# Patient Record
Sex: Female | Born: 1950
Health system: Southern US, Community
[De-identification: ages and names within clinical notes are randomized; demographics above are authoritative.]

## PROBLEM LIST (undated history)

## (undated) DIAGNOSIS — I10 Essential (primary) hypertension: Secondary | ICD-10-CM

## (undated) DIAGNOSIS — G4730 Sleep apnea, unspecified: Secondary | ICD-10-CM

## (undated) DIAGNOSIS — R011 Cardiac murmur, unspecified: Secondary | ICD-10-CM

## (undated) DIAGNOSIS — G2581 Restless legs syndrome: Secondary | ICD-10-CM

## (undated) DIAGNOSIS — F329 Major depressive disorder, single episode, unspecified: Secondary | ICD-10-CM

## (undated) DIAGNOSIS — B1920 Unspecified viral hepatitis C without hepatic coma: Secondary | ICD-10-CM

## (undated) DIAGNOSIS — E278 Other specified disorders of adrenal gland: Secondary | ICD-10-CM

## (undated) DIAGNOSIS — F32A Depression, unspecified: Secondary | ICD-10-CM

## (undated) DIAGNOSIS — E039 Hypothyroidism, unspecified: Secondary | ICD-10-CM

## (undated) HISTORY — DX: Essential (primary) hypertension: I10

## (undated) HISTORY — DX: Restless legs syndrome: G25.81

## (undated) HISTORY — DX: Cardiac murmur, unspecified: R01.1

## (undated) HISTORY — DX: Major depressive disorder, single episode, unspecified: F32.9

## (undated) HISTORY — DX: Depression, unspecified: F32.A

## (undated) HISTORY — DX: Sleep apnea, unspecified: G47.30

## (undated) HISTORY — DX: Hypothyroidism, unspecified: E03.9

## (undated) HISTORY — DX: Other specified disorders of adrenal gland: E27.8

## (undated) HISTORY — DX: Unspecified viral hepatitis C without hepatic coma: B19.20

---

## 1972-07-20 HISTORY — PX: APPENDECTOMY: SHX54

## 2002-07-20 HISTORY — PX: REPLACEMENT TOTAL KNEE: SUR1224

## 2005-07-20 HISTORY — PX: CATARACT EXTRACTION: SUR2

## 2011-10-23 DIAGNOSIS — D3500 Benign neoplasm of unspecified adrenal gland: Secondary | ICD-10-CM | POA: Insufficient documentation

## 2011-10-23 HISTORY — PX: ANKLE FRACTURE SURGERY: SHX122

## 2012-10-26 ENCOUNTER — Ambulatory Visit: Payer: Self-pay | Admitting: Family Medicine

## 2013-03-16 ENCOUNTER — Ambulatory Visit: Payer: Self-pay | Admitting: Ophthalmology

## 2013-03-27 ENCOUNTER — Ambulatory Visit: Payer: Self-pay | Admitting: Ophthalmology

## 2013-11-08 ENCOUNTER — Ambulatory Visit: Payer: Self-pay | Admitting: Gastroenterology

## 2013-12-01 ENCOUNTER — Ambulatory Visit: Payer: Self-pay | Admitting: Gastroenterology

## 2014-03-15 ENCOUNTER — Ambulatory Visit: Payer: Self-pay | Admitting: Emergency Medicine

## 2014-03-19 ENCOUNTER — Ambulatory Visit: Payer: Self-pay | Admitting: Family Medicine

## 2014-11-09 NOTE — Op Note (Signed)
PATIENT NAME:  Lauren Combs, Lauren Combs MR#:  [id number] DATE OF BIRTH:  July 20, 1951  DATE OF PROCEDURE:  03/27/2013  PREOPERATIVE DIAGNOSIS:  Cataract, right eye.   POSTOPERATIVE DIAGNOSIS:  Cataract, right eye.  PROCEDURE PERFORMED:  Extracapsular cataract extraction using phacoemulsification with placement of an Alcon [id number], 18.5-diopter posterior chamber lens, serial #[id number].  SURGEON:  Loura Back. Cristan Scherzer, MD  ASSISTANT:  None.  ANESTHESIA:  4% lidocaine and 0.75% Marcaine in a 50/50 mixture with 10 units/mL of Hylenex added, given as a peribulbar.   ANESTHESIOLOGIST:  Dr. Andree Elk.  COMPLICATIONS:  None.  ESTIMATED BLOOD LOSS:  Less than 1 ml.  DESCRIPTION OF PROCEDURE:  The patient was brought to the operating room and given a peribulbar block.  The patient was then prepped and draped in the usual fashion.  The vertical rectus muscles were imbricated using 5-0 silk sutures.  These sutures were then clamped to the sterile drapes as bridle sutures.  A limbal peritomy was performed extending two clock hours and hemostasis was obtained with cautery.  A partial thickness scleral groove was made at the surgical limbus and dissected anteriorly in a lamellar dissection using an Alcon crescent knife.  The anterior chamber was entered superonasally with a Superblade and through the lamellar dissection with a 2.6 mm keratome.  DisCoVisc was used to replace the aqueous and a continuous tear capsulorrhexis was carried out.  Hydrodissection and hydrodelineation were carried out with balanced salt and a 27 gauge canula.  The nucleus was rotated to confirm the effectiveness of the hydrodissection.  Phacoemulsification was carried out using a divide-and-conquer technique.  Total ultrasound time was 1 minutes and 27 seconds with an average power of 24.0 percent and CDE 39.80.  Irrigation/aspiration was used to remove the residual cortex.  DisCoVisc was used to inflate the capsule and the internal  incision was enlarged to 3 mm with the crescent knife.  The intraocular lens was folded and inserted into the capsular bag using the AcrySert delivery system.  Irrigation/aspiration was used to remove the residual DisCoVisc.  Miostat was injected into the anterior chamber through the paracentesis track to inflate the anterior chamber and induce miosis.  A tenth of a milliliter of cefuroxime, delivering 1 mg of the drug, was placed into the anterior chamber via the paracentesis tract. The wound was checked for leaks and none were found. The conjunctiva was closed with cautery and the bridle sutures were removed.  Two drops of 0.3% Vigamox were placed on the eye.   An eye shield was placed on the eye.  The patient was discharged to the recovery room in good condition. ____________________________ Loura Back Mattia Osterman, MD sad:sb D: 03/27/2013 14:07:50 ET T: 03/27/2013 15:11:30 ET JOB#: [id number]  cc: Remo Lipps A. Zyaire Dumas, MD, <Dictator> Martie Lee MD ELECTRONICALLY SIGNED 04/03/2013 13:12

## 2014-12-31 ENCOUNTER — Other Ambulatory Visit: Payer: Self-pay | Admitting: Family Medicine

## 2014-12-31 DIAGNOSIS — I10 Essential (primary) hypertension: Secondary | ICD-10-CM | POA: Insufficient documentation

## 2014-12-31 HISTORY — DX: Essential (primary) hypertension: I10

## 2014-12-31 MED ORDER — BISOPROLOL-HYDROCHLOROTHIAZIDE 10-6.25 MG PO TABS
1.0000 | ORAL_TABLET | Freq: Every day | ORAL | Status: DC
Start: 2014-12-31 — End: 2015-01-25

## 2014-12-31 NOTE — Telephone Encounter (Signed)
Prescription for Ziac 10-6.25 g is sent to patient's pharmacy.

## 2014-12-31 NOTE — Telephone Encounter (Signed)
PT IS TOTALLY OUT OF MEDS AND IS HAVING PALAPATIONS

## 2015-01-01 NOTE — Telephone Encounter (Signed)
Refill completed by Dr. Manuella Ghazi.

## 2015-01-03 ENCOUNTER — Telehealth: Payer: Self-pay | Admitting: Family Medicine

## 2015-01-03 MED ORDER — TEMAZEPAM 15 MG PO CAPS: 15.0000 mg | ORAL_CAPSULE | Freq: Every evening | ORAL | Status: DC | PRN

## 2015-01-03 NOTE — Telephone Encounter (Signed)
Erroneous enounter

## 2015-01-03 NOTE — Telephone Encounter (Signed)
Pt is requesting a refill on Restoril 15mg .  She is completely out.  Pt has an upcoming scheduled appointment on 01/24/15 @ 11am

## 2015-01-22 ENCOUNTER — Ambulatory Visit: Payer: Self-pay | Admitting: Gastroenterology

## 2015-01-22 DIAGNOSIS — R740 Nonspecific elevation of levels of transaminase and lactic acid dehydrogenase [LDH]: Secondary | ICD-10-CM

## 2015-01-22 DIAGNOSIS — H65499 Other chronic nonsuppurative otitis media, unspecified ear: Secondary | ICD-10-CM | POA: Insufficient documentation

## 2015-01-22 DIAGNOSIS — E039 Hypothyroidism, unspecified: Secondary | ICD-10-CM | POA: Insufficient documentation

## 2015-01-22 DIAGNOSIS — IMO0002 Reserved for concepts with insufficient information to code with codable children: Secondary | ICD-10-CM | POA: Insufficient documentation

## 2015-01-22 DIAGNOSIS — G4730 Sleep apnea, unspecified: Secondary | ICD-10-CM

## 2015-01-22 DIAGNOSIS — B1920 Unspecified viral hepatitis C without hepatic coma: Secondary | ICD-10-CM

## 2015-01-22 DIAGNOSIS — A09 Infectious gastroenteritis and colitis, unspecified: Secondary | ICD-10-CM | POA: Insufficient documentation

## 2015-01-22 DIAGNOSIS — E278 Other specified disorders of adrenal gland: Secondary | ICD-10-CM | POA: Insufficient documentation

## 2015-01-22 DIAGNOSIS — N281 Cyst of kidney, acquired: Secondary | ICD-10-CM | POA: Insufficient documentation

## 2015-01-22 DIAGNOSIS — Z9189 Other specified personal risk factors, not elsewhere classified: Secondary | ICD-10-CM | POA: Insufficient documentation

## 2015-01-22 DIAGNOSIS — F329 Major depressive disorder, single episode, unspecified: Secondary | ICD-10-CM | POA: Insufficient documentation

## 2015-01-22 DIAGNOSIS — G2581 Restless legs syndrome: Secondary | ICD-10-CM | POA: Insufficient documentation

## 2015-01-22 DIAGNOSIS — Z13220 Encounter for screening for lipoid disorders: Secondary | ICD-10-CM | POA: Insufficient documentation

## 2015-01-22 DIAGNOSIS — R0789 Other chest pain: Secondary | ICD-10-CM | POA: Insufficient documentation

## 2015-01-22 DIAGNOSIS — Z0000 Encounter for general adult medical examination without abnormal findings: Secondary | ICD-10-CM | POA: Insufficient documentation

## 2015-01-22 DIAGNOSIS — G4700 Insomnia, unspecified: Secondary | ICD-10-CM | POA: Insufficient documentation

## 2015-01-22 DIAGNOSIS — J0110 Acute frontal sinusitis, unspecified: Secondary | ICD-10-CM | POA: Insufficient documentation

## 2015-01-22 DIAGNOSIS — J019 Acute sinusitis, unspecified: Secondary | ICD-10-CM | POA: Insufficient documentation

## 2015-01-22 DIAGNOSIS — J309 Allergic rhinitis, unspecified: Secondary | ICD-10-CM | POA: Insufficient documentation

## 2015-01-22 DIAGNOSIS — F32A Depression, unspecified: Secondary | ICD-10-CM | POA: Insufficient documentation

## 2015-01-22 DIAGNOSIS — R011 Cardiac murmur, unspecified: Secondary | ICD-10-CM | POA: Insufficient documentation

## 2015-01-22 DIAGNOSIS — H9209 Otalgia, unspecified ear: Secondary | ICD-10-CM | POA: Insufficient documentation

## 2015-01-22 DIAGNOSIS — Z111 Encounter for screening for respiratory tuberculosis: Secondary | ICD-10-CM | POA: Insufficient documentation

## 2015-01-22 HISTORY — DX: Cardiac murmur, unspecified: R01.1

## 2015-01-22 HISTORY — DX: Other specified disorders of adrenal gland: E27.8

## 2015-01-22 HISTORY — DX: Hypothyroidism, unspecified: E03.9

## 2015-01-22 HISTORY — DX: Unspecified viral hepatitis C without hepatic coma: B19.20

## 2015-01-22 HISTORY — DX: Sleep apnea, unspecified: G47.30

## 2015-01-22 HISTORY — DX: Depression, unspecified: F32.A

## 2015-01-22 HISTORY — DX: Restless legs syndrome: G25.81

## 2015-01-24 ENCOUNTER — Encounter: Payer: Self-pay | Admitting: Family Medicine

## 2015-01-25 ENCOUNTER — Other Ambulatory Visit: Payer: Self-pay | Admitting: Family Medicine

## 2015-01-25 DIAGNOSIS — I10 Essential (primary) hypertension: Secondary | ICD-10-CM

## 2015-01-25 MED ORDER — LEVOTHYROXINE SODIUM 125 MCG PO TABS: 125.0000 ug | ORAL_TABLET | Freq: Every day | ORAL | Status: DC

## 2015-01-25 MED ORDER — BISOPROLOL-HYDROCHLOROTHIAZIDE 10-6.25 MG PO TABS: 1.0000 | ORAL_TABLET | Freq: Every day | ORAL | Status: DC

## 2015-01-25 MED ORDER — TEMAZEPAM 15 MG PO CAPS: 15.0000 mg | ORAL_CAPSULE | Freq: Every evening | ORAL | Status: DC | PRN

## 2015-01-25 NOTE — Telephone Encounter (Signed)
Patient had appt yesterday 01/24/2015 but had to leave due to wait time, refilled meds, pt next appt 02/01/15

## 2015-01-30 NOTE — Progress Notes (Signed)
This encounter was created in error - please disregard.

## 2015-01-31 ENCOUNTER — Telehealth: Payer: Self-pay | Admitting: Family Medicine

## 2015-01-31 NOTE — Telephone Encounter (Signed)
Pt had appointment for tomorrow however she had to reschedule it because her medicare insurance the medical part will not start until 02-18-15. She is requesting enough of paroxepine 30mg  be called in to [facility]. She has been completely out for 1wk.

## 2015-01-31 NOTE — Telephone Encounter (Signed)
Okay to refill Paroxetine 30 mg 1 tablet daily for 30 days

## 2015-01-31 NOTE — Telephone Encounter (Signed)
Patient is requesting refill of Paroxepine 30mg , she had an appointment scheduled for 02/01/2015 butshe cancelled appointment due to her medical insurance will not start until 02/18/2015 says she has been completely out for 1 week. I did not reorder medication because per our records this medication PARoxetine HCl 30MG , 1 Tablet daily, #90, 90 days starting 06/29/2014, No Refill. Active.

## 2015-02-01 ENCOUNTER — Encounter: Payer: Self-pay | Admitting: Family Medicine

## 2015-02-01 MED ORDER — PAROXETINE HCL 30 MG PO TABS: 30.0000 mg | ORAL_TABLET | Freq: Every day | ORAL | Status: DC

## 2015-02-01 NOTE — Telephone Encounter (Signed)
Medication has been refilled and sent to [place], patient has been notified

## 2015-02-19 ENCOUNTER — Encounter: Payer: Self-pay | Admitting: Family Medicine

## 2015-02-19 ENCOUNTER — Ambulatory Visit (INDEPENDENT_AMBULATORY_CARE_PROVIDER_SITE_OTHER): Payer: Medicare Other | Admitting: Family Medicine

## 2015-02-19 VITALS — BP 108/63 | HR 70 | Temp 98.4°F | Resp 16 | Ht 63.0 in | Wt 233.8 lb

## 2015-02-19 DIAGNOSIS — B182 Chronic viral hepatitis C: Secondary | ICD-10-CM | POA: Diagnosis not present

## 2015-02-19 DIAGNOSIS — I10 Essential (primary) hypertension: Secondary | ICD-10-CM

## 2015-02-19 MED ORDER — BISOPROLOL FUMARATE 10 MG PO TABS: 10.0000 mg | ORAL_TABLET | Freq: Every day | ORAL | Status: DC

## 2015-02-19 NOTE — Progress Notes (Signed)
Name: Lauren Combs   MRN: [id number]    DOB: [birth year]   Date:02/19/2015       Progress Note  Subjective  Chief Complaint  Chief Complaint  Patient presents with  . Annual Exam    CPE  . Referral    Hep C  . Medication Refill    HPI Pt. Is here to renew her referral to Dr. Allen Norris (gastroenterology) for chronic Hepatitis C. She has been seen by Dr. Allen Norris last year and he recommended treatment with Solvaldi and ribavirin but the insurance did not approve the treatment. She now has Medicare and is requesting a referral back to Dr. Allen Norris for continued follow up of Hep C.  Past Medical History  Diagnosis Date  . Depression   . Heart murmur     Past Surgical History  Procedure Laterality Date  . Appendectomy  1974  . Replacement total knee Bilateral 2004  . Cataract extraction Left 2007  . Ankle fracture surgery Right 10/23/2011    Reduction of fracture;open    Family History  Problem Relation Age of Onset  . Cancer Mother     Lung Cancer  . Heart disease Father   . Diabetes Father   . Fibromyalgia Sister   . Thyroid disease Daughter     History   Social History  . Marital Status: Divorced    Spouse Name: N/A  . Number of Children: 2  . Years of Education: N/A   Occupational History  . Not on file.   Social History Main Topics  . Smoking status: Never Smoker   . Smokeless tobacco: Never Used  . Alcohol Use: 0.0 oz/week    0 Standard drinks or equivalent per week     Comment: occasional alcohol use  . Drug Use: No  . Sexual Activity: Not on file   Other Topics Concern  . Not on file   Social History Narrative     Current outpatient prescriptions:  .  bisoprolol-hydrochlorothiazide (ZIAC) 10-6.25 MG per tablet, Take 1 tablet by mouth daily., Disp: 30 tablet, Rfl: 0 .  levothyroxine (SYNTHROID, LEVOTHROID) 125 MCG tablet, Take 1 tablet (125 mcg total) by mouth daily before breakfast. 125 MCG tablet taken daily., Disp: 30 tablet, Rfl: 0 .  PARoxetine  (PAXIL) 30 MG tablet, Take 1 tablet (30 mg total) by mouth daily., Disp: 30 tablet, Rfl: 0 .  temazepam (RESTORIL) 15 MG capsule, Take 1 capsule (15 mg total) by mouth at bedtime as needed for sleep., Disp: 30 capsule, Rfl: 0 .  triamcinolone (NASACORT ALLERGY 24HR) 55 MCG/ACT AERO nasal inhaler, Place 2 sprays into the nose., Disp: , Rfl:  .  oxyCODONE (OXY IR/ROXICODONE) 5 MG immediate release tablet, Take 1 tablet by mouth., Disp: , Rfl:   No Known Allergies   Review of Systems  Constitutional: Positive for malaise/fatigue. Negative for fever and chills.  Respiratory: Negative for shortness of breath.   Cardiovascular: Negative for chest pain.  Gastrointestinal: Positive for nausea. Negative for vomiting, abdominal pain, diarrhea and blood in stool.      Objective  Filed Vitals:   02/19/15 1152  BP: 108/63  Pulse: 70  Temp: 98.4 F (36.9 C)  TempSrc: Oral  Resp: 16  Height: 5\' 3"  (1.6 m)  Weight: 233 lb 12.8 oz (106.051 kg)  SpO2: 93%    Physical Exam  Constitutional: She is oriented to person, place, and time and well-developed, well-nourished, and in no distress.  HENT:  Head: Normocephalic and atraumatic.  Cardiovascular: Normal rate and regular rhythm.   Pulmonary/Chest: Effort normal and breath sounds normal.  Neurological: She is alert and oriented to person, place, and time.  Nursing note and vitals reviewed.    Assessment & Plan 1. Benign essential HTN DC HCTZ because of lower than normal blood pressure. Patient is asymptomatic. Follow-up in 3 months. - bisoprolol (ZEBETA) 10 MG tablet; Take 1 tablet (10 mg total) by mouth daily.  Dispense: 90 tablet; Refill: 0  2. Chronic hepatitis C without hepatic coma Patient has chronic hepatitis C, and is being followed by Dr. Allen Norris. Referral to gastroenterology will be renewed. - Ambulatory referral to Gastroenterology    Dossie Der Asad A. [facility] 02/19/2015 12:20  PM

## 2015-02-21 ENCOUNTER — Encounter: Payer: Self-pay | Admitting: Gastroenterology

## 2015-02-21 ENCOUNTER — Encounter (INDEPENDENT_AMBULATORY_CARE_PROVIDER_SITE_OTHER): Payer: Self-pay

## 2015-02-21 ENCOUNTER — Ambulatory Visit: Payer: Medicare Other | Admitting: Gastroenterology

## 2015-02-21 ENCOUNTER — Other Ambulatory Visit: Payer: Self-pay

## 2015-02-21 ENCOUNTER — Ambulatory Visit (INDEPENDENT_AMBULATORY_CARE_PROVIDER_SITE_OTHER): Payer: Medicare Other | Admitting: Gastroenterology

## 2015-02-21 VITALS — BP 167/93 | HR 77 | Temp 97.5°F | Ht 65.0 in | Wt 236.0 lb

## 2015-02-21 DIAGNOSIS — B182 Chronic viral hepatitis C: Secondary | ICD-10-CM | POA: Diagnosis not present

## 2015-02-21 NOTE — Progress Notes (Signed)
   Primary Care Physician: Keith Rake, MD  Primary Gastroenterologist:  Dr. Lucilla Lame  Chief Complaint  Patient presents with  . follow up Hep C    HPI: Lauren Combs is a 64 y.o. female here for follow-up of hepatitis C. The last time she was seen her fibrosis score was too low for the medication to be covered by the insurance. The patient states she has been feeling well. She also reports that she was likely infected with hepatitis C many years ago. The patient has no skin symptoms at the present time. She would like to now be treated for her hepatitis C.  Current Outpatient Prescriptions  Medication Sig Dispense Refill  . bisoprolol (ZEBETA) 10 MG tablet Take 1 tablet (10 mg total) by mouth daily. 90 tablet 0  . levothyroxine (SYNTHROID, LEVOTHROID) 125 MCG tablet Take 1 tablet (125 mcg total) by mouth daily before breakfast. 125 MCG tablet taken daily. 30 tablet 0  . PARoxetine (PAXIL) 30 MG tablet Take 1 tablet (30 mg total) by mouth daily. 30 tablet 0  . temazepam (RESTORIL) 15 MG capsule Take 1 capsule (15 mg total) by mouth at bedtime as needed for sleep. 30 capsule 0  . triamcinolone (NASACORT ALLERGY 24HR) 55 MCG/ACT AERO nasal inhaler Place 2 sprays into the nose.     No current facility-administered medications for this visit.    Allergies as of 02/21/2015  . (No Known Allergies)    ROS:  General: Negative for anorexia, weight loss, fever, chills, fatigue, weakness. ENT: Negative for hoarseness, difficulty swallowing , nasal congestion. CV: Negative for chest pain, angina, palpitations, dyspnea on exertion, peripheral edema.  Respiratory: Negative for dyspnea at rest, dyspnea on exertion, cough, sputum, wheezing.  GI: See history of present illness. GU:  Negative for dysuria, hematuria, urinary incontinence, urinary frequency, nocturnal urination.  Endo: Negative for unusual weight change.    Physical Examination:   BP 167/93 mmHg  Pulse 77  Temp(Src) 97.5  F (36.4 C) (Oral)  Ht 5\' 5"  (1.651 m)  Wt 236 lb (107.049 kg)  BMI 39.27 kg/m2  General: Well-nourished, well-developed in no acute distress.  Eyes: No icterus. Conjunctivae pink. Mouth: Oropharyngeal mucosa moist and pink , no lesions erythema or exudate. Lungs: Clear to auscultation bilaterally. Non-labored. Heart: Regular rate and rhythm, no murmurs rubs or gallops.  Abdomen: Bowel sounds are normal, nontender, nondistended, no hepatosplenomegaly or masses, no abdominal bruits or hernia , no rebound or guarding.   Extremities: No lower extremity edema. No clubbing or deformities. Neuro: Alert and oriented x 3.  Grossly intact. Skin: Warm and dry, no jaundice.   Psych: Alert and cooperative, normal mood and affect.  Labs:    Imaging Studies: No results found.  Assessment and Plan:   [name] is a 64 y.o. y/o female who comes in with a history of hepatitis C contracted approximate 40 years ago. The patient now would like to be treated for her hepatitis C. Recent labs will be drawn and the patient will then be prescribed treatment for her hepatitis C.   Note: This dictation was prepared with Dragon dictation along with smaller phrase technology. Any transcriptional errors that result from this process are unintentional.

## 2015-02-28 ENCOUNTER — Encounter: Payer: Self-pay | Admitting: Gastroenterology

## 2015-03-03 ENCOUNTER — Other Ambulatory Visit: Payer: Self-pay | Admitting: Family Medicine

## 2015-03-07 ENCOUNTER — Telehealth: Payer: Self-pay | Admitting: Family Medicine

## 2015-03-07 MED ORDER — TEMAZEPAM 15 MG PO CAPS: 15.0000 mg | ORAL_CAPSULE | Freq: Every evening | ORAL | Status: DC | PRN

## 2015-03-07 NOTE — Telephone Encounter (Signed)
Medication has been refilled and sent to [facility] [address] 

## 2015-03-07 NOTE — Telephone Encounter (Signed)
Requesting refill on Temazepam. This is her sleep aid and she is requesting that it be sent to [facility]

## 2015-04-01 ENCOUNTER — Other Ambulatory Visit: Payer: Self-pay | Admitting: Family Medicine

## 2015-04-02 NOTE — Telephone Encounter (Signed)
Medication has been refilled and sent to [address]

## 2015-04-09 ENCOUNTER — Other Ambulatory Visit: Payer: Self-pay

## 2015-04-09 DIAGNOSIS — B182 Chronic viral hepatitis C: Secondary | ICD-10-CM

## 2015-04-25 NOTE — Telephone Encounter (Signed)
error 

## 2015-05-05 ENCOUNTER — Other Ambulatory Visit: Payer: Self-pay | Admitting: Family Medicine

## 2015-05-22 ENCOUNTER — Ambulatory Visit: Payer: Medicare Other | Admitting: Family Medicine

## 2015-05-30 ENCOUNTER — Ambulatory Visit: Payer: Medicare Other | Admitting: Family Medicine

## 2015-05-30 ENCOUNTER — Ambulatory Visit (INDEPENDENT_AMBULATORY_CARE_PROVIDER_SITE_OTHER): Payer: Medicare Other | Admitting: Family Medicine

## 2015-05-30 ENCOUNTER — Encounter: Payer: Self-pay | Admitting: Family Medicine

## 2015-05-30 VITALS — BP 132/74 | HR 79 | Temp 98.6°F | Resp 16 | Ht 65.0 in | Wt 237.6 lb

## 2015-05-30 DIAGNOSIS — G4700 Insomnia, unspecified: Secondary | ICD-10-CM | POA: Diagnosis not present

## 2015-05-30 MED ORDER — TEMAZEPAM 15 MG PO CAPS: 15.0000 mg | ORAL_CAPSULE | Freq: Every evening | ORAL | Status: DC | PRN

## 2015-05-30 NOTE — Progress Notes (Signed)
Name: Lauren Combs   MRN: [id number]    DOB: 1950-08-29   Date:05/30/2015       Progress Note  Subjective  Chief Complaint  Chief Complaint  Patient presents with  . Medication Management    this patient is here due to her insurance wanting her to switch to another medication that will help with insomina. [facility] sent a form with the suggested medication; it's attached.    Insomnia Primary symptoms: difficulty falling asleep, frequent awakening.  Past treatments include medication. Typical bedtime:  10-11 P.M..  How long after going to bed to you fall asleep: 15-30 minutes.   PMH includes: no depression, restless leg syndrome (history of restless leg syndrome, symptoms relieved with current therapy.).    Past Medical History  Diagnosis Date  . Depression   . Heart murmur   . Benign essential HTN 12/31/2014  . Hepatitis C infection 01/22/2015  . Adult hypothyroidism 01/22/2015  . Clinical depression 01/22/2015  . Apnea, sleep 01/22/2015  . Cardiac murmur 01/22/2015  . Restless leg 01/22/2015  . Adrenal mass ([place]) 01/22/2015    Past Surgical History  Procedure Laterality Date  . Appendectomy  1974  . Replacement total knee Bilateral 2004  . Cataract extraction Left 2007  . Ankle fracture surgery Right 10/23/2011    Reduction of fracture;open    Family History  Problem Relation Age of Onset  . Cancer Mother     Lung Cancer  . Heart disease Father   . Diabetes Father   . Fibromyalgia Sister   . Thyroid disease Daughter     Social History   Social History  . Marital Status: Divorced    Spouse Name: N/A  . Number of Children: 2  . Years of Education: N/A   Occupational History  . Not on file.   Social History Main Topics  . Smoking status: Never Smoker   . Smokeless tobacco: Never Used  . Alcohol Use: 0.0 oz/week    0 Standard drinks or equivalent per week     Comment: occasional alcohol use  . Drug Use: No  . Sexual Activity: Not on file   Other Topics Concern  .  Not on file   Social History Narrative     Current outpatient prescriptions:  .  bisoprolol (ZEBETA) 10 MG tablet, Take 1 tablet (10 mg total) by mouth daily., Disp: 90 tablet, Rfl: 0 .  DAKLINZA 60 MG TABS, , Disp: , Rfl:  .  levothyroxine (SYNTHROID, LEVOTHROID) 125 MCG tablet, Take 1 tablet (125 mcg total) by mouth daily before breakfast., Disp: 30 tablet, Rfl: 0 .  PARoxetine (PAXIL) 30 MG tablet, TAKE ONE TABLET BY MOUTH ONCE DAILY, Disp: 90 tablet, Rfl: 0 .  SOVALDI 400 MG TABS, , Disp: , Rfl:  .  temazepam (RESTORIL) 15 MG capsule, TAKE ONE CAPSULE BY MOUTH AT BEDTIME AS NEEDED FOR SLEEP, Disp: 30 capsule, Rfl: 2 .  triamcinolone (NASACORT ALLERGY 24HR) 55 MCG/ACT AERO nasal inhaler, Place 2 sprays into the nose., Disp: , Rfl:   No Known Allergies  Review of Systems  Respiratory: Negative for shortness of breath.   Cardiovascular: Negative for chest pain.  Neurological: Negative for dizziness.  Psychiatric/Behavioral: Negative for depression. The patient has insomnia. The patient is not nervous/anxious.     Objective  Filed Vitals:   05/30/15 1017  BP: 132/74  Pulse: 79  Temp: 98.6 F (37 C)  TempSrc: Oral  Resp: 16  Height: 5\' 5"  (1.651 m)  Weight:  237 lb 9.6 oz (107.775 kg)  SpO2: 96%    Physical Exam  Constitutional: She is oriented to person, place, and time and well-developed, well-nourished, and in no distress.  HENT:  Head: Normocephalic and atraumatic.  Cardiovascular: Normal rate, regular rhythm and normal heart sounds.   Pulmonary/Chest: Effort normal and breath sounds normal. She has no wheezes. She has no rales.  Neurological: She is alert and oriented to person, place, and time.  Psychiatric: Mood, memory, affect and judgment normal.  Nursing note and vitals reviewed.     Assessment & Plan  1. Cannot sleep  Discussed alternatives based on pharmacy recommendations. Patient is aware of the side effects and warnings about benzodiazepine use in  the elderly. She reports no side effects with her medicine and does not wish to discontinue taking Restoril at this time. Refills provided and follow-up in 3 months.  - temazepam (RESTORIL) 15 MG capsule; Take 1 capsule (15 mg total) by mouth at bedtime as needed. for sleep  Dispense: 30 capsule; Refill: 2   Basma Buchner Asad A. [facility] 05/30/2015 10:30 AM

## 2015-06-24 ENCOUNTER — Ambulatory Visit: Payer: Medicare Other | Admitting: Family Medicine

## 2015-08-01 ENCOUNTER — Other Ambulatory Visit: Payer: Self-pay | Admitting: Family Medicine

## 2015-08-08 ENCOUNTER — Other Ambulatory Visit: Payer: Self-pay

## 2015-08-08 ENCOUNTER — Telehealth: Payer: Self-pay | Admitting: Family Medicine

## 2015-08-08 NOTE — Telephone Encounter (Signed)
Pt needs refill on levothyroxine to be sent to [place].

## 2015-08-12 ENCOUNTER — Telehealth: Payer: Self-pay | Admitting: Family Medicine

## 2015-08-12 NOTE — Telephone Encounter (Signed)
Pt states she has been trying to get her Levothyroxone. Pt needs refill sent to [place].

## 2015-08-14 MED ORDER — LEVOTHYROXINE SODIUM 125 MCG PO TABS: 125.0000 ug | ORAL_TABLET | Freq: Every day | ORAL | Status: DC

## 2015-08-14 NOTE — Telephone Encounter (Signed)
Medication has been refilled and sent to [facility] [address] 

## 2015-08-15 NOTE — Telephone Encounter (Signed)
Medication has been refilled and sent to [place] on 08/14/2015 patient has been notified

## 2015-08-30 ENCOUNTER — Ambulatory Visit (INDEPENDENT_AMBULATORY_CARE_PROVIDER_SITE_OTHER): Payer: Medicare Other | Admitting: Family Medicine

## 2015-08-30 ENCOUNTER — Encounter: Payer: Self-pay | Admitting: Family Medicine

## 2015-08-30 VITALS — BP 140/80 | HR 71 | Temp 99.0°F | Resp 18 | Ht 65.0 in | Wt 241.6 lb

## 2015-08-30 DIAGNOSIS — I10 Essential (primary) hypertension: Secondary | ICD-10-CM

## 2015-08-30 DIAGNOSIS — E039 Hypothyroidism, unspecified: Secondary | ICD-10-CM | POA: Diagnosis not present

## 2015-08-30 DIAGNOSIS — E785 Hyperlipidemia, unspecified: Secondary | ICD-10-CM | POA: Diagnosis not present

## 2015-08-30 DIAGNOSIS — G4700 Insomnia, unspecified: Secondary | ICD-10-CM

## 2015-08-30 DIAGNOSIS — F329 Major depressive disorder, single episode, unspecified: Secondary | ICD-10-CM | POA: Diagnosis not present

## 2015-08-30 DIAGNOSIS — F32A Depression, unspecified: Secondary | ICD-10-CM

## 2015-08-30 MED ORDER — LEVOTHYROXINE SODIUM 125 MCG PO TABS: 125.0000 ug | ORAL_TABLET | Freq: Every day | ORAL | Status: DC

## 2015-08-30 MED ORDER — BISOPROLOL FUMARATE 10 MG PO TABS: 10.0000 mg | ORAL_TABLET | Freq: Every day | ORAL | Status: DC

## 2015-08-30 MED ORDER — PAROXETINE HCL 40 MG PO TABS: 40.0000 mg | ORAL_TABLET | Freq: Every day | ORAL | Status: DC

## 2015-08-30 MED ORDER — TEMAZEPAM 15 MG PO CAPS: 15.0000 mg | ORAL_CAPSULE | Freq: Every evening | ORAL | Status: DC | PRN

## 2015-08-30 NOTE — Progress Notes (Signed)
Name: Lauren Combs   MRN: [id number]    DOB: 09-16-1950   Date:08/30/2015       Progress Note  Subjective  Chief Complaint  Chief Complaint  Patient presents with  . Hypertension    pt here for 3 month follow up  . Hepatitis C    Insomnia Primary symptoms: difficulty falling asleep, frequent awakening.  The symptoms are aggravated by anxiety and SSRI use. Past treatments include medication. The treatment provided significant relief. Typical bedtime:  10-11 P.M..  How long after going to bed to you fall asleep: 15-30 minutes.   PMH includes: hypertension, depression, no chronic pain.  Depression      The patient presents with depression.  This is a chronic problem.The problem is unchanged.  Associated symptoms include helplessness, insomnia and decreased interest.  Associated symptoms include no decreased concentration, no fatigue, no hopelessness, no appetite change, no headaches and not sad.( Feels like not wanting to get out of bed, a 'bland sickening feeling')  Past treatments include SSRIs - Selective serotonin reuptake inhibitors.  Compliance with treatment is good.  Past medical history includes thyroid problem and depression.   Hypertension This is a chronic problem. The problem is controlled. Pertinent negatives include no blurred vision, chest pain, headaches, palpitations or shortness of breath. Past treatments include beta blockers. Hypertensive end-organ damage includes a thyroid problem. There is no history of kidney disease, CAD/MI or CVA.  Thyroid Problem Presents for follow-up visit. Patient reports no constipation, depressed mood, dry skin, fatigue, hair loss or palpitations. Past treatments include levothyroxine.     Past Medical History  Diagnosis Date  . Depression   . Heart murmur   . Benign essential HTN 12/31/2014  . Hepatitis C infection 01/22/2015  . Adult hypothyroidism 01/22/2015  . Clinical depression 01/22/2015  . Apnea, sleep 01/22/2015  . Cardiac murmur  01/22/2015  . Restless leg 01/22/2015  . Adrenal mass ([place]) 01/22/2015    Past Surgical History  Procedure Laterality Date  . Appendectomy  1974  . Replacement total knee Bilateral 2004  . Cataract extraction Left 2007  . Ankle fracture surgery Right 10/23/2011    Reduction of fracture;open    Family History  Problem Relation Age of Onset  . Cancer Mother     Lung Cancer  . Heart disease Father   . Diabetes Father   . Fibromyalgia Sister   . Thyroid disease Daughter     Social History   Social History  . Marital Status: Divorced    Spouse Name: N/A  . Number of Children: 2  . Years of Education: N/A   Occupational History  . Not on file.   Social History Main Topics  . Smoking status: Never Smoker   . Smokeless tobacco: Never Used  . Alcohol Use: 0.0 oz/week    0 Standard drinks or equivalent per week     Comment: occasional alcohol use  . Drug Use: No  . Sexual Activity: Not on file   Other Topics Concern  . Not on file   Social History Narrative     Current outpatient prescriptions:  .  bisoprolol (ZEBETA) 10 MG tablet, TAKE ONE TABLET BY MOUTH ONCE DAILY, Disp: 90 tablet, Rfl: 0 .  DAKLINZA 60 MG TABS, , Disp: , Rfl:  .  levothyroxine (SYNTHROID, LEVOTHROID) 125 MCG tablet, Take 1 tablet (125 mcg total) by mouth daily before breakfast., Disp: 30 tablet, Rfl: 0 .  PARoxetine (PAXIL) 30 MG tablet, TAKE ONE TABLET BY  MOUTH ONCE DAILY, Disp: 90 tablet, Rfl: 0 .  SOVALDI 400 MG TABS, , Disp: , Rfl:  .  temazepam (RESTORIL) 15 MG capsule, Take 1 capsule (15 mg total) by mouth at bedtime as needed. for sleep, Disp: 30 capsule, Rfl: 2 .  triamcinolone (NASACORT ALLERGY 24HR) 55 MCG/ACT AERO nasal inhaler, Place 2 sprays into the nose., Disp: , Rfl:   No Known Allergies   Review of Systems  Constitutional: Negative for appetite change and fatigue.  Eyes: Negative for blurred vision.  Respiratory: Negative for shortness of breath.   Cardiovascular: Negative for  chest pain and palpitations.  Gastrointestinal: Negative for constipation.  Neurological: Negative for headaches.  Psychiatric/Behavioral: Positive for depression. Negative for decreased concentration. The patient has insomnia.      Objective  Filed Vitals:   08/30/15 1013  BP: 140/80  Pulse: 71  Temp: 99 F (37.2 C)  Resp: 18  Height: 5\' 5"  (1.651 m)  Weight: 241 lb 9 oz (109.572 kg)  SpO2: 96%    Physical Exam  Constitutional: She is oriented to person, place, and time and well-developed, well-nourished, and in no distress.  HENT:  Head: Normocephalic and atraumatic.  Cardiovascular: Normal rate and regular rhythm.   No murmur heard. Pulmonary/Chest: Effort normal and breath sounds normal. She has no wheezes.  Abdominal: Soft. Bowel sounds are normal. There is no tenderness.  Neurological: She is alert and oriented to person, place, and time.  Psychiatric: Mood, memory, affect and judgment normal.  Nursing note and vitals reviewed.    Assessment & Plan  1. Benign essential HTN BP elevated, no change in therapy at this time. Advised to check blood pressure regularly and will up if readings persistently elevated - bisoprolol (ZEBETA) 10 MG tablet; Take 1 tablet (10 mg total) by mouth daily.  Dispense: 90 tablet; Refill: 1  2. Hypothyroidism, unspecified hypothyroidism type  - levothyroxine (SYNTHROID, LEVOTHROID) 125 MCG tablet; Take 1 tablet (125 mcg total) by mouth daily before breakfast.  Dispense: 90 tablet; Refill: 1 - TSH  3. Cannot sleep  - temazepam (RESTORIL) 15 MG capsule; Take 1 capsule (15 mg total) by mouth at bedtime as needed. for sleep  Dispense: 90 capsule; Refill: 1  4. Clinical depression Increase Paxil to 40 mg daily to help with residual symptoms of depression. - PARoxetine (PAXIL) 40 MG tablet; Take 1 tablet (40 mg total) by mouth daily.  Dispense: 90 tablet; Refill: 1  5. Hyperlipidemia  - Lipid Profile - Comprehensive Metabolic Panel  (CMET)   Roxanne Panek Asad A. [facility] 08/30/2015 10:16 AM

## 2015-09-05 DIAGNOSIS — E039 Hypothyroidism, unspecified: Secondary | ICD-10-CM | POA: Diagnosis not present

## 2015-09-05 DIAGNOSIS — E785 Hyperlipidemia, unspecified: Secondary | ICD-10-CM | POA: Diagnosis not present

## 2015-09-06 LAB — COMPREHENSIVE METABOLIC PANEL
A/G RATIO: 1.6 (ref 1.1–2.5)
ALK PHOS: 62 IU/L (ref 39–117)
ALT: 21 IU/L (ref 0–32)
AST: 23 IU/L (ref 0–40)
Albumin: 4.3 g/dL (ref 3.6–4.8)
BILIRUBIN TOTAL: 0.3 mg/dL (ref 0.0–1.2)
BUN/Creatinine Ratio: 15 (ref 11–26)
BUN: 12 mg/dL (ref 8–27)
CALCIUM: 9.6 mg/dL (ref 8.7–10.3)
CHLORIDE: 103 mmol/L (ref 96–106)
CO2: 20 mmol/L (ref 18–29)
Creatinine, Ser: 0.79 mg/dL (ref 0.57–1.00)
GFR calc Af Amer: 90 mL/min/{1.73_m2} (ref >59)
GFR calc non Af Amer: 78 mL/min/{1.73_m2} (ref >59)
Globulin, Total: 2.7 g/dL (ref 1.5–4.5)
Glucose: 120 mg/dL — ABNORMAL HIGH (ref 65–99)
POTASSIUM: 4.6 mmol/L (ref 3.5–5.2)
Sodium: 142 mmol/L (ref 134–144)
Total Protein: 7 g/dL (ref 6.0–8.5)

## 2015-09-06 LAB — LIPID PANEL
CHOL/HDL RATIO: 5.6 ratio — AB (ref 0.0–4.4)
Cholesterol, Total: 239 mg/dL — ABNORMAL HIGH (ref 100–199)
HDL: 43 mg/dL (ref >39)
LDL CALC: 151 mg/dL — AB (ref 0–99)
TRIGLYCERIDES: 224 mg/dL — AB (ref 0–149)
VLDL CHOLESTEROL CAL: 45 mg/dL — AB (ref 5–40)

## 2015-09-06 LAB — TSH: TSH: 0.791 u[IU]/mL (ref 0.450–4.500)

## 2015-11-03 DIAGNOSIS — J019 Acute sinusitis, unspecified: Secondary | ICD-10-CM | POA: Diagnosis not present

## 2015-11-12 ENCOUNTER — Encounter: Payer: Self-pay | Admitting: Family Medicine

## 2015-11-12 ENCOUNTER — Ambulatory Visit (INDEPENDENT_AMBULATORY_CARE_PROVIDER_SITE_OTHER): Payer: Medicare Other | Admitting: Family Medicine

## 2015-11-12 VITALS — BP 137/80 | HR 76 | Temp 98.8°F | Resp 16 | Ht 65.0 in | Wt 239.0 lb

## 2015-11-12 DIAGNOSIS — J321 Chronic frontal sinusitis: Secondary | ICD-10-CM | POA: Insufficient documentation

## 2015-11-12 DIAGNOSIS — J0100 Acute maxillary sinusitis, unspecified: Secondary | ICD-10-CM

## 2015-11-12 MED ORDER — PREDNISONE 10 MG (21) PO TBPK: 10.0000 mg | ORAL_TABLET | Freq: Every day | ORAL | Status: DC

## 2015-11-12 MED ORDER — AZITHROMYCIN 250 MG PO TABS: ORAL_TABLET | ORAL | Status: DC

## 2015-11-12 NOTE — Progress Notes (Signed)
Name: Lauren Combs   MRN: [id number]    DOB: September 10, 1950   Date:11/12/2015       Progress Note  Subjective  Chief Complaint  Chief Complaint  Patient presents with  . Sinus Problem    Sinus Problem This is a new problem. The current episode started 1 to 4 weeks ago (1 month ago). The problem has been gradually improving since onset. There has been no fever. Associated symptoms include congestion and sinus pressure. Pertinent negatives include no chills, coughing, neck pain, shortness of breath or sore throat. Past treatments include antibiotics (Has been seen at [facility], was prescribed Amoxicillin which did relieve her symtpoms to some extent but not completely.). The treatment provided moderate relief.    Past Medical History  Diagnosis Date  . Depression   . Heart murmur   . Benign essential HTN 12/31/2014  . Hepatitis C infection 01/22/2015  . Adult hypothyroidism 01/22/2015  . Clinical depression 01/22/2015  . Apnea, sleep 01/22/2015  . Cardiac murmur 01/22/2015  . Restless leg 01/22/2015  . Adrenal mass ([place]) 01/22/2015    Past Surgical History  Procedure Laterality Date  . Appendectomy  1974  . Replacement total knee Bilateral 2004  . Cataract extraction Left 2007  . Ankle fracture surgery Right 10/23/2011    Reduction of fracture;open    Family History  Problem Relation Age of Onset  . Cancer Mother     Lung Cancer  . Heart disease Father   . Diabetes Father   . Fibromyalgia Sister   . Thyroid disease Daughter     Social History   Social History  . Marital Status: Divorced    Spouse Name: N/A  . Number of Children: 2  . Years of Education: N/A   Occupational History  . Not on file.   Social History Main Topics  . Smoking status: Never Smoker   . Smokeless tobacco: Never Used  . Alcohol Use: 0.0 oz/week    0 Standard drinks or equivalent per week     Comment: occasional alcohol use  . Drug Use: No  . Sexual Activity: Not on file   Other Topics Concern   . Not on file   Social History Narrative     Current outpatient prescriptions:  .  bisoprolol (ZEBETA) 10 MG tablet, Take 1 tablet (10 mg total) by mouth daily., Disp: 90 tablet, Rfl: 1 .  levothyroxine (SYNTHROID, LEVOTHROID) 125 MCG tablet, Take 1 tablet (125 mcg total) by mouth daily before breakfast., Disp: 90 tablet, Rfl: 1 .  PARoxetine (PAXIL) 40 MG tablet, Take 1 tablet (40 mg total) by mouth daily., Disp: 90 tablet, Rfl: 1 .  SOVALDI 400 MG TABS, , Disp: , Rfl:  .  temazepam (RESTORIL) 15 MG capsule, Take 1 capsule (15 mg total) by mouth at bedtime as needed. for sleep, Disp: 90 capsule, Rfl: 1 .  triamcinolone (NASACORT ALLERGY 24HR) 55 MCG/ACT AERO nasal inhaler, Place 2 sprays into the nose., Disp: , Rfl:   No Known Allergies   Review of Systems  Constitutional: Negative for fever and chills.  HENT: Positive for congestion and sinus pressure. Negative for sore throat.   Respiratory: Negative for cough, shortness of breath and wheezing.   Cardiovascular: Negative for chest pain.  Musculoskeletal: Negative for neck pain.      Objective  Filed Vitals:   11/12/15 1056  BP: 137/80  Pulse: 76  Temp: 98.8 F (37.1 C)  TempSrc: Oral  Resp: 16  Height: 5'  5" (1.651 m)  Weight: 239 lb (108.41 kg)  SpO2: 97%    Physical Exam  Constitutional: She is oriented to person, place, and time and well-developed, well-nourished, and in no distress.  HENT:  Head: Normocephalic and atraumatic.  Right Ear: Tympanic membrane and ear canal normal.  Left Ear: Tympanic membrane and ear canal normal.  Nose: Right sinus exhibits maxillary sinus tenderness. Right sinus exhibits no frontal sinus tenderness. Left sinus exhibits maxillary sinus tenderness. Left sinus exhibits no frontal sinus tenderness.  Mouth/Throat: No posterior oropharyngeal erythema.  Cardiovascular: Normal rate and regular rhythm.   Pulmonary/Chest: Effort normal and breath sounds normal.  Neurological: She is  alert and oriented to person, place, and time.  Nursing note and vitals reviewed.  Assessment & Plan  1. Subacute maxillary sinusitis Persistent symptoms, not fully responsive to Amoxicillin. We'll start on Z-Pak and 6 course of prednisone. - azithromycin (ZITHROMAX) 250 MG tablet; 2 tabs po day 1, then 1 tab po q day x 4 days  Dispense: 6 tablet; Refill: 0 - predniSONE (STERAPRED UNI-PAK 21 TAB) 10 MG (21) TBPK tablet; Take 1 tablet (10 mg total) by mouth daily. 60 50 40 30 20 10  then STOP  Dispense: 21 tablet; Refill: 0   Calvin Chura Asad A. [facility] 11/12/2015 11:07 AM

## 2015-11-15 DIAGNOSIS — Z72820 Sleep deprivation: Secondary | ICD-10-CM | POA: Diagnosis not present

## 2015-11-15 DIAGNOSIS — M6240 Contracture of muscle, unspecified site: Secondary | ICD-10-CM | POA: Diagnosis not present

## 2015-11-15 DIAGNOSIS — M9901 Segmental and somatic dysfunction of cervical region: Secondary | ICD-10-CM | POA: Diagnosis not present

## 2015-11-15 DIAGNOSIS — M791 Myalgia: Secondary | ICD-10-CM | POA: Diagnosis not present

## 2015-11-15 DIAGNOSIS — G540 Brachial plexus disorders: Secondary | ICD-10-CM | POA: Diagnosis not present

## 2015-11-15 DIAGNOSIS — M9902 Segmental and somatic dysfunction of thoracic region: Secondary | ICD-10-CM | POA: Diagnosis not present

## 2015-11-15 DIAGNOSIS — R51 Headache: Secondary | ICD-10-CM | POA: Diagnosis not present

## 2015-11-18 DIAGNOSIS — Z72820 Sleep deprivation: Secondary | ICD-10-CM | POA: Diagnosis not present

## 2015-11-18 DIAGNOSIS — M791 Myalgia: Secondary | ICD-10-CM | POA: Diagnosis not present

## 2015-11-18 DIAGNOSIS — M6240 Contracture of muscle, unspecified site: Secondary | ICD-10-CM | POA: Diagnosis not present

## 2015-11-18 DIAGNOSIS — R51 Headache: Secondary | ICD-10-CM | POA: Diagnosis not present

## 2015-11-18 DIAGNOSIS — M9902 Segmental and somatic dysfunction of thoracic region: Secondary | ICD-10-CM | POA: Diagnosis not present

## 2015-11-18 DIAGNOSIS — G540 Brachial plexus disorders: Secondary | ICD-10-CM | POA: Diagnosis not present

## 2015-11-18 DIAGNOSIS — M9901 Segmental and somatic dysfunction of cervical region: Secondary | ICD-10-CM | POA: Diagnosis not present

## 2015-11-20 DIAGNOSIS — M9902 Segmental and somatic dysfunction of thoracic region: Secondary | ICD-10-CM | POA: Diagnosis not present

## 2015-11-20 DIAGNOSIS — M9901 Segmental and somatic dysfunction of cervical region: Secondary | ICD-10-CM | POA: Diagnosis not present

## 2015-11-20 DIAGNOSIS — M791 Myalgia: Secondary | ICD-10-CM | POA: Diagnosis not present

## 2015-11-20 DIAGNOSIS — M6240 Contracture of muscle, unspecified site: Secondary | ICD-10-CM | POA: Diagnosis not present

## 2015-11-20 DIAGNOSIS — Z72820 Sleep deprivation: Secondary | ICD-10-CM | POA: Diagnosis not present

## 2015-11-20 DIAGNOSIS — R51 Headache: Secondary | ICD-10-CM | POA: Diagnosis not present

## 2015-11-20 DIAGNOSIS — G540 Brachial plexus disorders: Secondary | ICD-10-CM | POA: Diagnosis not present

## 2015-11-22 DIAGNOSIS — R51 Headache: Secondary | ICD-10-CM | POA: Diagnosis not present

## 2015-11-22 DIAGNOSIS — M9901 Segmental and somatic dysfunction of cervical region: Secondary | ICD-10-CM | POA: Diagnosis not present

## 2015-11-22 DIAGNOSIS — Z72820 Sleep deprivation: Secondary | ICD-10-CM | POA: Diagnosis not present

## 2015-11-22 DIAGNOSIS — M791 Myalgia: Secondary | ICD-10-CM | POA: Diagnosis not present

## 2015-11-22 DIAGNOSIS — M6240 Contracture of muscle, unspecified site: Secondary | ICD-10-CM | POA: Diagnosis not present

## 2015-11-22 DIAGNOSIS — M9902 Segmental and somatic dysfunction of thoracic region: Secondary | ICD-10-CM | POA: Diagnosis not present

## 2015-11-22 DIAGNOSIS — G540 Brachial plexus disorders: Secondary | ICD-10-CM | POA: Diagnosis not present

## 2015-11-25 DIAGNOSIS — R51 Headache: Secondary | ICD-10-CM | POA: Diagnosis not present

## 2015-11-25 DIAGNOSIS — M9902 Segmental and somatic dysfunction of thoracic region: Secondary | ICD-10-CM | POA: Diagnosis not present

## 2015-11-25 DIAGNOSIS — G540 Brachial plexus disorders: Secondary | ICD-10-CM | POA: Diagnosis not present

## 2015-11-25 DIAGNOSIS — M9901 Segmental and somatic dysfunction of cervical region: Secondary | ICD-10-CM | POA: Diagnosis not present

## 2015-11-25 DIAGNOSIS — M6240 Contracture of muscle, unspecified site: Secondary | ICD-10-CM | POA: Diagnosis not present

## 2015-11-25 DIAGNOSIS — Z72820 Sleep deprivation: Secondary | ICD-10-CM | POA: Diagnosis not present

## 2015-11-25 DIAGNOSIS — M791 Myalgia: Secondary | ICD-10-CM | POA: Diagnosis not present

## 2015-11-27 DIAGNOSIS — M6240 Contracture of muscle, unspecified site: Secondary | ICD-10-CM | POA: Diagnosis not present

## 2015-11-27 DIAGNOSIS — Z72820 Sleep deprivation: Secondary | ICD-10-CM | POA: Diagnosis not present

## 2015-11-27 DIAGNOSIS — M9902 Segmental and somatic dysfunction of thoracic region: Secondary | ICD-10-CM | POA: Diagnosis not present

## 2015-11-27 DIAGNOSIS — M9901 Segmental and somatic dysfunction of cervical region: Secondary | ICD-10-CM | POA: Diagnosis not present

## 2015-11-27 DIAGNOSIS — G540 Brachial plexus disorders: Secondary | ICD-10-CM | POA: Diagnosis not present

## 2015-11-27 DIAGNOSIS — R51 Headache: Secondary | ICD-10-CM | POA: Diagnosis not present

## 2015-11-27 DIAGNOSIS — M791 Myalgia: Secondary | ICD-10-CM | POA: Diagnosis not present

## 2015-11-28 ENCOUNTER — Ambulatory Visit
Admission: RE | Admit: 2015-11-28 | Discharge: 2015-11-28 | Disposition: A | Discharge status: Home / Self Care | Payer: Medicare Other | Source: Ambulatory Visit | Attending: Family Medicine | Admitting: Family Medicine

## 2015-11-28 ENCOUNTER — Encounter: Payer: Self-pay | Admitting: Family Medicine

## 2015-11-28 ENCOUNTER — Ambulatory Visit (INDEPENDENT_AMBULATORY_CARE_PROVIDER_SITE_OTHER): Payer: Medicare Other | Admitting: Family Medicine

## 2015-11-28 VITALS — BP 110/72 | HR 71 | Temp 98.5°F | Resp 16 | Ht 65.0 in | Wt 241.2 lb

## 2015-11-28 DIAGNOSIS — R51 Headache: Secondary | ICD-10-CM

## 2015-11-28 DIAGNOSIS — R079 Chest pain, unspecified: Secondary | ICD-10-CM | POA: Diagnosis not present

## 2015-11-28 DIAGNOSIS — I517 Cardiomegaly: Secondary | ICD-10-CM | POA: Insufficient documentation

## 2015-11-28 DIAGNOSIS — R519 Headache, unspecified: Secondary | ICD-10-CM

## 2015-11-28 NOTE — Progress Notes (Signed)
Name: Lauren Combs   MRN: [id number]    DOB: Feb 04, 1951   Date:11/28/2015       Progress Note  Subjective  Chief Complaint  Chief Complaint  Patient presents with  . URI    cough, facial pain on and off for 1 month    HPI  Pressure in the Face: Pt. Presents for persistent pressure on the face, feels soreness in her face, sometimes in her back and chest. She feels as if its her sinuses (was prescribed Azithromycin and Prednisone 2 weeks ago). Sometimes she feels sharp pain in her upper back and chest.   Past Medical History  Diagnosis Date  . Depression   . Heart murmur   . Benign essential HTN 12/31/2014  . Hepatitis C infection 01/22/2015  . Adult hypothyroidism 01/22/2015  . Clinical depression 01/22/2015  . Apnea, sleep 01/22/2015  . Cardiac murmur 01/22/2015  . Restless leg 01/22/2015  . Adrenal mass ([place]) 01/22/2015    Past Surgical History  Procedure Laterality Date  . Appendectomy  1974  . Replacement total knee Bilateral 2004  . Cataract extraction Left 2007  . Ankle fracture surgery Right 10/23/2011    Reduction of fracture;open    Family History  Problem Relation Age of Onset  . Cancer Mother     Lung Cancer  . Heart disease Father   . Diabetes Father   . Fibromyalgia Sister   . Thyroid disease Daughter     Social History   Social History  . Marital Status: Divorced    Spouse Name: N/A  . Number of Children: 2  . Years of Education: N/A   Occupational History  . Not on file.   Social History Main Topics  . Smoking status: Never Smoker   . Smokeless tobacco: Never Used  . Alcohol Use: 0.0 oz/week    0 Standard drinks or equivalent per week     Comment: occasional alcohol use  . Drug Use: No  . Sexual Activity: Not on file   Other Topics Concern  . Not on file   Social History Narrative     Current outpatient prescriptions:  .  bisoprolol (ZEBETA) 10 MG tablet, Take 1 tablet (10 mg total) by mouth daily., Disp: 90 tablet, Rfl: 1 .   levothyroxine (SYNTHROID, LEVOTHROID) 125 MCG tablet, Take 1 tablet (125 mcg total) by mouth daily before breakfast., Disp: 90 tablet, Rfl: 1 .  PARoxetine (PAXIL) 40 MG tablet, Take 1 tablet (40 mg total) by mouth daily., Disp: 90 tablet, Rfl: 1 .  SOVALDI 400 MG TABS, , Disp: , Rfl:  .  temazepam (RESTORIL) 15 MG capsule, Take 1 capsule (15 mg total) by mouth at bedtime as needed. for sleep, Disp: 90 capsule, Rfl: 1 .  triamcinolone (NASACORT ALLERGY 24HR) 55 MCG/ACT AERO nasal inhaler, Place 2 sprays into the nose., Disp: , Rfl:   No Known Allergies   Review of Systems  Constitutional: Negative for fever and chills.  HENT: Positive for congestion.   Respiratory: Positive for cough.   Cardiovascular: Positive for chest pain.    Objective  Filed Vitals:   11/28/15 1428  BP: 110/72  Pulse: 71  Temp: 98.5 F (36.9 C)  TempSrc: Oral  Resp: 16  Height: 5\' 5"  (1.651 m)  Weight: 241 lb 3.2 oz (109.408 kg)  SpO2: 95%    Physical Exam  Constitutional: She is oriented to person, place, and time and well-developed, well-nourished, and in no distress.  HENT:  Head: Normocephalic  and atraumatic.  Nose: Right sinus exhibits no maxillary sinus tenderness. Left sinus exhibits no maxillary sinus tenderness.  Nasal mucosal inflammation, turbinates mildly enlarged. Tenderness over the cheek bones.  Cardiovascular: Normal rate and regular rhythm.   Pulmonary/Chest: Effort normal and breath sounds normal.  Neurological: She is alert and oriented to person, place, and time.  Nursing note and vitals reviewed.      Assessment & Plan  1. Chest pain, unspecified chest pain type EKG is unremarkable, obtain chest x-ray and lab work. Recommend that she follow up with cardiology. - DG Chest 2 View; Future - EKG 12-Lead - CBC with Differential - Comprehensive Metabolic Panel (CMET)  2. Facial pain Tenderness over the left and right cheeks, more tenderness over the sinuses. Based on  history and symptoms, [place] further course of antibiotic therapy may be warranted. We will review chest x-ray first.  Axcel Horsch Asad A. [facility] 11/28/2015 2:41 PM

## 2015-11-29 DIAGNOSIS — M9902 Segmental and somatic dysfunction of thoracic region: Secondary | ICD-10-CM | POA: Diagnosis not present

## 2015-11-29 DIAGNOSIS — M791 Myalgia: Secondary | ICD-10-CM | POA: Diagnosis not present

## 2015-11-29 DIAGNOSIS — M9901 Segmental and somatic dysfunction of cervical region: Secondary | ICD-10-CM | POA: Diagnosis not present

## 2015-11-29 DIAGNOSIS — R51 Headache: Secondary | ICD-10-CM | POA: Diagnosis not present

## 2015-11-29 DIAGNOSIS — Z72820 Sleep deprivation: Secondary | ICD-10-CM | POA: Diagnosis not present

## 2015-11-29 DIAGNOSIS — M6240 Contracture of muscle, unspecified site: Secondary | ICD-10-CM | POA: Diagnosis not present

## 2015-11-29 DIAGNOSIS — G540 Brachial plexus disorders: Secondary | ICD-10-CM | POA: Diagnosis not present

## 2015-11-29 LAB — COMPREHENSIVE METABOLIC PANEL
ALBUMIN: 4.3 g/dL (ref 3.6–4.8)
ALK PHOS: 52 IU/L (ref 39–117)
ALT: 22 IU/L (ref 0–32)
AST: 21 IU/L (ref 0–40)
Albumin/Globulin Ratio: 1.8 (ref 1.2–2.2)
BUN / CREAT RATIO: 22 (ref 12–28)
BUN: 17 mg/dL (ref 8–27)
Bilirubin Total: 0.2 mg/dL (ref 0.0–1.2)
CO2: 20 mmol/L (ref 18–29)
CREATININE: 0.76 mg/dL (ref 0.57–1.00)
Calcium: 9.8 mg/dL (ref 8.7–10.3)
Chloride: 102 mmol/L (ref 96–106)
GFR calc Af Amer: 95 mL/min/{1.73_m2} (ref >59)
GFR calc non Af Amer: 82 mL/min/{1.73_m2} (ref >59)
GLUCOSE: 139 mg/dL — AB (ref 65–99)
Globulin, Total: 2.4 g/dL (ref 1.5–4.5)
Potassium: 4.6 mmol/L (ref 3.5–5.2)
Sodium: 142 mmol/L (ref 134–144)
TOTAL PROTEIN: 6.7 g/dL (ref 6.0–8.5)

## 2015-11-29 LAB — CBC WITH DIFFERENTIAL/PLATELET
BASOS ABS: 0 10*3/uL (ref 0.0–0.2)
Basos: 0 %
EOS (ABSOLUTE): 0.2 10*3/uL (ref 0.0–0.4)
Eos: 3 %
HEMOGLOBIN: 13.3 g/dL (ref 11.1–15.9)
Hematocrit: 40 % (ref 34.0–46.6)
IMMATURE GRANS (ABS): 0 10*3/uL (ref 0.0–0.1)
IMMATURE GRANULOCYTES: 0 %
LYMPHS: 28 %
Lymphocytes Absolute: 2.1 10*3/uL (ref 0.7–3.1)
MCH: 28.6 pg (ref 26.6–33.0)
MCHC: 33.3 g/dL (ref 31.5–35.7)
MCV: 86 fL (ref 79–97)
MONOCYTES: 6 %
Monocytes Absolute: 0.5 10*3/uL (ref 0.1–0.9)
NEUTROS PCT: 63 %
Neutrophils Absolute: 4.8 10*3/uL (ref 1.4–7.0)
Platelets: 208 10*3/uL (ref 150–379)
RBC: 4.65 x10E6/uL (ref 3.77–5.28)
RDW: 13.8 % (ref 12.3–15.4)
WBC: 7.6 10*3/uL (ref 3.4–10.8)

## 2015-12-02 DIAGNOSIS — R0602 Shortness of breath: Secondary | ICD-10-CM | POA: Diagnosis not present

## 2015-12-02 DIAGNOSIS — I10 Essential (primary) hypertension: Secondary | ICD-10-CM | POA: Diagnosis not present

## 2015-12-02 DIAGNOSIS — Z6841 Body Mass Index (BMI) 40.0 and over, adult: Secondary | ICD-10-CM | POA: Diagnosis not present

## 2015-12-02 DIAGNOSIS — I208 Other forms of angina pectoris: Secondary | ICD-10-CM | POA: Diagnosis not present

## 2015-12-02 DIAGNOSIS — R002 Palpitations: Secondary | ICD-10-CM | POA: Diagnosis not present

## 2015-12-02 DIAGNOSIS — E669 Obesity, unspecified: Secondary | ICD-10-CM | POA: Diagnosis not present

## 2015-12-11 DIAGNOSIS — M9902 Segmental and somatic dysfunction of thoracic region: Secondary | ICD-10-CM | POA: Diagnosis not present

## 2015-12-11 DIAGNOSIS — M9901 Segmental and somatic dysfunction of cervical region: Secondary | ICD-10-CM | POA: Diagnosis not present

## 2015-12-11 DIAGNOSIS — G540 Brachial plexus disorders: Secondary | ICD-10-CM | POA: Diagnosis not present

## 2015-12-11 DIAGNOSIS — M791 Myalgia: Secondary | ICD-10-CM | POA: Diagnosis not present

## 2015-12-11 DIAGNOSIS — M6240 Contracture of muscle, unspecified site: Secondary | ICD-10-CM | POA: Diagnosis not present

## 2015-12-11 DIAGNOSIS — Z72820 Sleep deprivation: Secondary | ICD-10-CM | POA: Diagnosis not present

## 2015-12-11 DIAGNOSIS — R51 Headache: Secondary | ICD-10-CM | POA: Diagnosis not present

## 2015-12-13 DIAGNOSIS — M9901 Segmental and somatic dysfunction of cervical region: Secondary | ICD-10-CM | POA: Diagnosis not present

## 2015-12-13 DIAGNOSIS — R51 Headache: Secondary | ICD-10-CM | POA: Diagnosis not present

## 2015-12-13 DIAGNOSIS — M6240 Contracture of muscle, unspecified site: Secondary | ICD-10-CM | POA: Diagnosis not present

## 2015-12-13 DIAGNOSIS — Z72820 Sleep deprivation: Secondary | ICD-10-CM | POA: Diagnosis not present

## 2015-12-13 DIAGNOSIS — M791 Myalgia: Secondary | ICD-10-CM | POA: Diagnosis not present

## 2015-12-13 DIAGNOSIS — M9902 Segmental and somatic dysfunction of thoracic region: Secondary | ICD-10-CM | POA: Diagnosis not present

## 2015-12-13 DIAGNOSIS — G540 Brachial plexus disorders: Secondary | ICD-10-CM | POA: Diagnosis not present

## 2015-12-18 ENCOUNTER — Encounter: Payer: Self-pay | Admitting: Family Medicine

## 2015-12-18 ENCOUNTER — Ambulatory Visit (INDEPENDENT_AMBULATORY_CARE_PROVIDER_SITE_OTHER): Payer: Medicare Other | Admitting: Family Medicine

## 2015-12-18 VITALS — BP 140/82 | HR 76 | Temp 98.6°F | Resp 16 | Ht 65.0 in | Wt 239.2 lb

## 2015-12-18 DIAGNOSIS — J321 Chronic frontal sinusitis: Secondary | ICD-10-CM | POA: Diagnosis not present

## 2015-12-18 DIAGNOSIS — J029 Acute pharyngitis, unspecified: Secondary | ICD-10-CM | POA: Insufficient documentation

## 2015-12-18 DIAGNOSIS — R51 Headache: Secondary | ICD-10-CM | POA: Diagnosis not present

## 2015-12-18 DIAGNOSIS — M9902 Segmental and somatic dysfunction of thoracic region: Secondary | ICD-10-CM | POA: Diagnosis not present

## 2015-12-18 DIAGNOSIS — M6240 Contracture of muscle, unspecified site: Secondary | ICD-10-CM | POA: Diagnosis not present

## 2015-12-18 DIAGNOSIS — Z72820 Sleep deprivation: Secondary | ICD-10-CM | POA: Diagnosis not present

## 2015-12-18 DIAGNOSIS — M791 Myalgia: Secondary | ICD-10-CM | POA: Diagnosis not present

## 2015-12-18 DIAGNOSIS — M9901 Segmental and somatic dysfunction of cervical region: Secondary | ICD-10-CM | POA: Diagnosis not present

## 2015-12-18 DIAGNOSIS — G540 Brachial plexus disorders: Secondary | ICD-10-CM | POA: Diagnosis not present

## 2015-12-18 LAB — POCT RAPID STREP A (OFFICE): RAPID STREP A SCREEN: NEGATIVE

## 2015-12-18 MED ORDER — AZITHROMYCIN 500 MG PO TABS: 500.0000 mg | ORAL_TABLET | Freq: Every day | ORAL | Status: DC

## 2015-12-18 NOTE — Progress Notes (Signed)
Name: Lauren Combs   MRN: [id number]    DOB: 10/08/1950   Date:12/18/2015       Progress Note  Subjective  Chief Complaint  Chief Complaint  Patient presents with  . Sinusitis    follow up, not feeling any better    HPI  Sinus Pressure: Ongoing symptoms despite treatment with 2 antibiotics (penicillin and macrolide) and prednisone. Feels like there is pressure in her face(mostly centered around and under her cheekbones), eyes are puffed up and she carries a constant headache.  She has a productive cough with scant sputum. Feels like she has pressure and soreness in her chest.    Past Medical History  Diagnosis Date  . Depression   . Heart murmur   . Benign essential HTN 12/31/2014  . Hepatitis C infection 01/22/2015  . Adult hypothyroidism 01/22/2015  . Clinical depression 01/22/2015  . Apnea, sleep 01/22/2015  . Cardiac murmur 01/22/2015  . Restless leg 01/22/2015  . Adrenal mass ([place]) 01/22/2015    Past Surgical History  Procedure Laterality Date  . Appendectomy  1974  . Replacement total knee Bilateral 2004  . Cataract extraction Left 2007  . Ankle fracture surgery Right 10/23/2011    Reduction of fracture;open    Family History  Problem Relation Age of Onset  . Cancer Mother     Lung Cancer  . Heart disease Father   . Diabetes Father   . Fibromyalgia Sister   . Thyroid disease Daughter     Social History   Social History  . Marital Status: Divorced    Spouse Name: N/A  . Number of Children: 2  . Years of Education: N/A   Occupational History  . Not on file.   Social History Main Topics  . Smoking status: Never Smoker   . Smokeless tobacco: Never Used  . Alcohol Use: 0.0 oz/week    0 Standard drinks or equivalent per week     Comment: occasional alcohol use  . Drug Use: No  . Sexual Activity: Not on file   Other Topics Concern  . Not on file   Social History Narrative     Current outpatient prescriptions:  .  bisoprolol (ZEBETA) 10 MG tablet,  Take 1 tablet (10 mg total) by mouth daily., Disp: 90 tablet, Rfl: 1 .  levothyroxine (SYNTHROID, LEVOTHROID) 125 MCG tablet, Take 1 tablet (125 mcg total) by mouth daily before breakfast., Disp: 90 tablet, Rfl: 1 .  PARoxetine (PAXIL) 40 MG tablet, Take 1 tablet (40 mg total) by mouth daily., Disp: 90 tablet, Rfl: 1 .  SOVALDI 400 MG TABS, , Disp: , Rfl:  .  temazepam (RESTORIL) 15 MG capsule, Take 1 capsule (15 mg total) by mouth at bedtime as needed. for sleep, Disp: 90 capsule, Rfl: 1 .  triamcinolone (NASACORT ALLERGY 24HR) 55 MCG/ACT AERO nasal inhaler, Place 2 sprays into the nose., Disp: , Rfl:   No Known Allergies   Review of Systems  Constitutional: Negative for fever and chills.  HENT: Positive for congestion and sore throat.   Respiratory: Positive for cough and sputum production. Negative for shortness of breath and wheezing.   Cardiovascular: Positive for chest pain.    Objective  Filed Vitals:   12/18/15 1531  BP: 140/82  Pulse: 76  Temp: 98.6 F (37 C)  TempSrc: Oral  Resp: 16  Height: 5\' 5"  (1.651 m)  Weight: 239 lb 3.2 oz (108.5 kg)  SpO2: 96%    Physical Exam  Constitutional: She  is well-developed, well-nourished, and in no distress.  HENT:  Right Ear: Tympanic membrane and ear canal normal.  Left Ear: Tympanic membrane and ear canal normal.  Nose: Right sinus exhibits frontal sinus tenderness. Left sinus exhibits frontal sinus tenderness.  Mouth/Throat: Posterior oropharyngeal erythema present.  Left nasal cavity inflamed, turbinate hypertrophy Tenderness to palpation over the left and right cheek bones.  Cardiovascular: Normal rate and regular rhythm.   Murmur heard.  Systolic murmur is present with a grade of 2/6  Pulmonary/Chest: Breath sounds normal. She has no wheezes. She has no rhonchi.  Nursing note and vitals reviewed.       Assessment & Plan  1. Sore throat Rapid strep test is negative. - POCT rapid strep A  2. Chronic frontal  sinusitis Symptoms present for over 6 weeks, will start on second course of Z-Pak, obtain sinus CT scan for evaluation of symptoms. - CT Maxillofacial WO CM; Future - azithromycin (ZITHROMAX) 500 MG tablet; Take 1 tablet (500 mg total) by mouth daily.  Dispense: 3 tablet; Refill: 0    Lauren Combs Asad A. [facility] 12/18/2015 4:02 PM

## 2015-12-24 ENCOUNTER — Ambulatory Visit: Payer: Medicare Other | Attending: Family Medicine

## 2015-12-24 DIAGNOSIS — R002 Palpitations: Secondary | ICD-10-CM | POA: Diagnosis not present

## 2015-12-25 DIAGNOSIS — I208 Other forms of angina pectoris: Secondary | ICD-10-CM | POA: Diagnosis not present

## 2015-12-25 DIAGNOSIS — R0602 Shortness of breath: Secondary | ICD-10-CM | POA: Diagnosis not present

## 2015-12-27 DIAGNOSIS — M6240 Contracture of muscle, unspecified site: Secondary | ICD-10-CM | POA: Diagnosis not present

## 2015-12-27 DIAGNOSIS — M791 Myalgia: Secondary | ICD-10-CM | POA: Diagnosis not present

## 2015-12-27 DIAGNOSIS — M9902 Segmental and somatic dysfunction of thoracic region: Secondary | ICD-10-CM | POA: Diagnosis not present

## 2015-12-27 DIAGNOSIS — M9901 Segmental and somatic dysfunction of cervical region: Secondary | ICD-10-CM | POA: Diagnosis not present

## 2015-12-27 DIAGNOSIS — G540 Brachial plexus disorders: Secondary | ICD-10-CM | POA: Diagnosis not present

## 2015-12-27 DIAGNOSIS — R51 Headache: Secondary | ICD-10-CM | POA: Diagnosis not present

## 2015-12-27 DIAGNOSIS — Z72820 Sleep deprivation: Secondary | ICD-10-CM | POA: Diagnosis not present

## 2015-12-30 DIAGNOSIS — G540 Brachial plexus disorders: Secondary | ICD-10-CM | POA: Diagnosis not present

## 2015-12-30 DIAGNOSIS — M791 Myalgia: Secondary | ICD-10-CM | POA: Diagnosis not present

## 2015-12-30 DIAGNOSIS — M9902 Segmental and somatic dysfunction of thoracic region: Secondary | ICD-10-CM | POA: Diagnosis not present

## 2015-12-30 DIAGNOSIS — Z72820 Sleep deprivation: Secondary | ICD-10-CM | POA: Diagnosis not present

## 2015-12-30 DIAGNOSIS — R51 Headache: Secondary | ICD-10-CM | POA: Diagnosis not present

## 2015-12-30 DIAGNOSIS — M9901 Segmental and somatic dysfunction of cervical region: Secondary | ICD-10-CM | POA: Diagnosis not present

## 2015-12-30 DIAGNOSIS — M6240 Contracture of muscle, unspecified site: Secondary | ICD-10-CM | POA: Diagnosis not present

## 2016-01-02 DIAGNOSIS — J40 Bronchitis, not specified as acute or chronic: Secondary | ICD-10-CM | POA: Diagnosis not present

## 2016-01-02 DIAGNOSIS — E079 Disorder of thyroid, unspecified: Secondary | ICD-10-CM | POA: Diagnosis not present

## 2016-01-02 DIAGNOSIS — R0602 Shortness of breath: Secondary | ICD-10-CM | POA: Diagnosis not present

## 2016-01-02 DIAGNOSIS — E669 Obesity, unspecified: Secondary | ICD-10-CM | POA: Diagnosis not present

## 2016-01-02 DIAGNOSIS — I10 Essential (primary) hypertension: Secondary | ICD-10-CM | POA: Diagnosis not present

## 2016-01-02 DIAGNOSIS — I208 Other forms of angina pectoris: Secondary | ICD-10-CM | POA: Diagnosis not present

## 2016-01-02 DIAGNOSIS — R002 Palpitations: Secondary | ICD-10-CM | POA: Diagnosis not present

## 2016-01-03 DIAGNOSIS — M791 Myalgia: Secondary | ICD-10-CM | POA: Diagnosis not present

## 2016-01-03 DIAGNOSIS — R51 Headache: Secondary | ICD-10-CM | POA: Diagnosis not present

## 2016-01-03 DIAGNOSIS — M9901 Segmental and somatic dysfunction of cervical region: Secondary | ICD-10-CM | POA: Diagnosis not present

## 2016-01-03 DIAGNOSIS — M6240 Contracture of muscle, unspecified site: Secondary | ICD-10-CM | POA: Diagnosis not present

## 2016-01-03 DIAGNOSIS — M9902 Segmental and somatic dysfunction of thoracic region: Secondary | ICD-10-CM | POA: Diagnosis not present

## 2016-01-03 DIAGNOSIS — Z72820 Sleep deprivation: Secondary | ICD-10-CM | POA: Diagnosis not present

## 2016-01-03 DIAGNOSIS — G540 Brachial plexus disorders: Secondary | ICD-10-CM | POA: Diagnosis not present

## 2016-01-09 DIAGNOSIS — J310 Chronic rhinitis: Secondary | ICD-10-CM | POA: Diagnosis not present

## 2016-01-09 DIAGNOSIS — R05 Cough: Secondary | ICD-10-CM | POA: Diagnosis not present

## 2016-01-09 DIAGNOSIS — G4733 Obstructive sleep apnea (adult) (pediatric): Secondary | ICD-10-CM | POA: Diagnosis not present

## 2016-01-09 DIAGNOSIS — R0609 Other forms of dyspnea: Secondary | ICD-10-CM | POA: Diagnosis not present

## 2016-01-10 DIAGNOSIS — M791 Myalgia: Secondary | ICD-10-CM | POA: Diagnosis not present

## 2016-01-10 DIAGNOSIS — M6240 Contracture of muscle, unspecified site: Secondary | ICD-10-CM | POA: Diagnosis not present

## 2016-01-10 DIAGNOSIS — M9902 Segmental and somatic dysfunction of thoracic region: Secondary | ICD-10-CM | POA: Diagnosis not present

## 2016-01-10 DIAGNOSIS — Z72820 Sleep deprivation: Secondary | ICD-10-CM | POA: Diagnosis not present

## 2016-01-10 DIAGNOSIS — M9901 Segmental and somatic dysfunction of cervical region: Secondary | ICD-10-CM | POA: Diagnosis not present

## 2016-01-10 DIAGNOSIS — G540 Brachial plexus disorders: Secondary | ICD-10-CM | POA: Diagnosis not present

## 2016-01-10 DIAGNOSIS — R51 Headache: Secondary | ICD-10-CM | POA: Diagnosis not present

## 2016-01-15 DIAGNOSIS — R51 Headache: Secondary | ICD-10-CM | POA: Diagnosis not present

## 2016-01-15 DIAGNOSIS — M791 Myalgia: Secondary | ICD-10-CM | POA: Diagnosis not present

## 2016-01-15 DIAGNOSIS — G540 Brachial plexus disorders: Secondary | ICD-10-CM | POA: Diagnosis not present

## 2016-01-15 DIAGNOSIS — Z72820 Sleep deprivation: Secondary | ICD-10-CM | POA: Diagnosis not present

## 2016-01-15 DIAGNOSIS — M9901 Segmental and somatic dysfunction of cervical region: Secondary | ICD-10-CM | POA: Diagnosis not present

## 2016-01-15 DIAGNOSIS — M6240 Contracture of muscle, unspecified site: Secondary | ICD-10-CM | POA: Diagnosis not present

## 2016-01-15 DIAGNOSIS — M9902 Segmental and somatic dysfunction of thoracic region: Secondary | ICD-10-CM | POA: Diagnosis not present

## 2016-01-22 DIAGNOSIS — R0982 Postnasal drip: Secondary | ICD-10-CM | POA: Diagnosis not present

## 2016-01-22 DIAGNOSIS — Z8719 Personal history of other diseases of the digestive system: Secondary | ICD-10-CM | POA: Diagnosis not present

## 2016-01-22 DIAGNOSIS — R05 Cough: Secondary | ICD-10-CM | POA: Diagnosis not present

## 2016-01-22 DIAGNOSIS — G4733 Obstructive sleep apnea (adult) (pediatric): Secondary | ICD-10-CM | POA: Diagnosis not present

## 2016-02-06 DIAGNOSIS — E669 Obesity, unspecified: Secondary | ICD-10-CM | POA: Diagnosis not present

## 2016-02-06 DIAGNOSIS — R0602 Shortness of breath: Secondary | ICD-10-CM | POA: Diagnosis not present

## 2016-02-06 DIAGNOSIS — R002 Palpitations: Secondary | ICD-10-CM | POA: Diagnosis not present

## 2016-02-06 DIAGNOSIS — I10 Essential (primary) hypertension: Secondary | ICD-10-CM | POA: Diagnosis not present

## 2016-02-06 DIAGNOSIS — J40 Bronchitis, not specified as acute or chronic: Secondary | ICD-10-CM | POA: Diagnosis not present

## 2016-02-06 DIAGNOSIS — E079 Disorder of thyroid, unspecified: Secondary | ICD-10-CM | POA: Diagnosis not present

## 2016-02-06 DIAGNOSIS — J329 Chronic sinusitis, unspecified: Secondary | ICD-10-CM | POA: Diagnosis not present

## 2016-02-06 DIAGNOSIS — K219 Gastro-esophageal reflux disease without esophagitis: Secondary | ICD-10-CM | POA: Diagnosis not present

## 2016-02-06 DIAGNOSIS — I208 Other forms of angina pectoris: Secondary | ICD-10-CM | POA: Diagnosis not present

## 2016-02-07 ENCOUNTER — Other Ambulatory Visit: Payer: Self-pay | Admitting: Family Medicine

## 2016-02-27 ENCOUNTER — Ambulatory Visit: Payer: Medicare Other | Admitting: Family Medicine

## 2016-03-10 ENCOUNTER — Ambulatory Visit (INDEPENDENT_AMBULATORY_CARE_PROVIDER_SITE_OTHER): Payer: Medicare Other | Admitting: Family Medicine

## 2016-03-10 ENCOUNTER — Encounter: Payer: Self-pay | Admitting: Family Medicine

## 2016-03-10 VITALS — BP 142/84 | HR 76 | Temp 98.5°F | Resp 16 | Ht 65.0 in | Wt 234.8 lb

## 2016-03-10 DIAGNOSIS — I10 Essential (primary) hypertension: Secondary | ICD-10-CM

## 2016-03-10 DIAGNOSIS — E039 Hypothyroidism, unspecified: Secondary | ICD-10-CM

## 2016-03-10 DIAGNOSIS — F32A Depression, unspecified: Secondary | ICD-10-CM

## 2016-03-10 DIAGNOSIS — G4700 Insomnia, unspecified: Secondary | ICD-10-CM

## 2016-03-10 DIAGNOSIS — F329 Major depressive disorder, single episode, unspecified: Secondary | ICD-10-CM

## 2016-03-10 MED ORDER — TEMAZEPAM 15 MG PO CAPS
15.0000 mg | ORAL_CAPSULE | Freq: Every evening | ORAL | 1 refills | Status: AC | PRN
Start: 2016-03-10 — End: ?

## 2016-03-10 MED ORDER — PAROXETINE HCL 40 MG PO TABS: 40.0000 mg | ORAL_TABLET | Freq: Every day | ORAL | 1 refills | Status: AC

## 2016-03-10 MED ORDER — LEVOTHYROXINE SODIUM 125 MCG PO TABS: 125.0000 ug | ORAL_TABLET | Freq: Every day | ORAL | 1 refills | Status: AC

## 2016-03-10 MED ORDER — BISOPROLOL FUMARATE 10 MG PO TABS: 10.0000 mg | ORAL_TABLET | Freq: Every day | ORAL | 1 refills | Status: AC

## 2016-03-10 NOTE — Progress Notes (Signed)
Name: Lauren Combs   MRN: [id number]    DOB: [birth year]   Date:03/10/2016       Progress Note  Subjective  Chief Complaint  Chief Complaint  Patient presents with  . Depression    6 month recheck, medication refills  . Hypothyroidism  . Hypertension    Depression         This is a chronic problem.  The onset quality is gradual.   Associated symptoms include insomnia and headaches.  Associated symptoms include no helplessness, no hopelessness, no restlessness and not sad.  Past treatments include SSRIs - Selective serotonin reuptake inhibitors.  Past medical history includes thyroid problem.   Hypertension  This is a chronic problem. The problem is unchanged. Associated symptoms include headaches. Pertinent negatives include no blurred vision, chest pain or palpitations. Past treatments include beta blockers. Hypertensive end-organ damage includes a thyroid problem.  Insomnia  Primary symptoms: difficulty falling asleep.  The onset quality is gradual. Past treatments include medication. PMH includes: depression.  Thyroid Problem  Presents for follow-up visit. Patient reports no cold intolerance, depressed mood, diarrhea, palpitations or weight gain. The symptoms have been stable.    Patient has now moved to [facility], will be establishing care with a new physician in the coming months Past Medical History:  Diagnosis Date  . Adrenal mass ([place]) 01/22/2015  . Adult hypothyroidism 01/22/2015  . Apnea, sleep 01/22/2015  . Benign essential HTN 12/31/2014  . Cardiac murmur 01/22/2015  . Clinical depression 01/22/2015  . Depression   . Heart murmur   . Hepatitis C infection 01/22/2015  . Restless leg 01/22/2015    Past Surgical History:  Procedure Laterality Date  . ANKLE FRACTURE SURGERY Right 10/23/2011   Reduction of fracture;open  . APPENDECTOMY  1974  . CATARACT EXTRACTION Left 2007  . REPLACEMENT TOTAL KNEE Bilateral 2004    Family History  Problem Relation Age of  Onset  . Cancer Mother     Lung Cancer  . Heart disease Father   . Diabetes Father   . Fibromyalgia Sister   . Thyroid disease Daughter     Social History   Social History  . Marital status: Divorced    Spouse name: N/A  . Number of children: 2  . Years of education: N/A   Occupational History  . Not on file.   Social History Main Topics  . Smoking status: Never Smoker  . Smokeless tobacco: Never Used  . Alcohol use 0.0 oz/week     Comment: occasional alcohol use  . Drug use: No  . Sexual activity: Not on file   Other Topics Concern  . Not on file   Social History Narrative  . No narrative on file     Current Outpatient Prescriptions:  .  bisoprolol (ZEBETA) 10 MG tablet, TAKE ONE TABLET BY MOUTH ONCE DAILY, Disp: 90 tablet, Rfl: 0 .  levothyroxine (SYNTHROID, LEVOTHROID) 125 MCG tablet, Take 1 tablet (125 mcg total) by mouth daily before breakfast., Disp: 90 tablet, Rfl: 1 .  omeprazole (PRILOSEC) 20 MG capsule, Take by mouth., Disp: , Rfl:  .  PARoxetine (PAXIL) 40 MG tablet, Take 1 tablet (40 mg total) by mouth daily., Disp: 90 tablet, Rfl: 1 .  temazepam (RESTORIL) 15 MG capsule, Take 1 capsule (15 mg total) by mouth at bedtime as needed. for sleep, Disp: 90 capsule, Rfl: 1 .  VENTOLIN HFA 108 (90 Base) MCG/ACT inhaler, , Disp: , Rfl:   No Known Allergies  Review of Systems  Constitutional: Negative for weight gain.  Eyes: Negative for blurred vision.  Cardiovascular: Negative for chest pain and palpitations.  Gastrointestinal: Negative for diarrhea.  Neurological: Positive for headaches.  Endo/Heme/Allergies: Negative for cold intolerance.  Psychiatric/Behavioral: Positive for depression. The patient has insomnia.      Objective  Vitals:   03/10/16 1547  BP: (!) 142/84  Pulse: 76  Resp: 16  Temp: 98.5 F (36.9 C)  TempSrc: Oral  SpO2: 93%  Weight: 234 lb 12.8 oz (106.5 kg)  Height: 5\' 5"  (1.651 m)    Physical Exam  Constitutional: She is  oriented to person, place, and time and well-developed, well-nourished, and in no distress.  HENT:  Head: Normocephalic and atraumatic.  Cardiovascular: Normal rate, regular rhythm and normal heart sounds.   No murmur heard. Pulmonary/Chest: Effort normal and breath sounds normal. She has no wheezes.  Neurological: She is alert and oriented to person, place, and time.  Psychiatric: Mood, memory, affect and judgment normal.  Nursing note and vitals reviewed.   Assessment & Plan  1. Clinical depression Stable, continue on SSRI - PARoxetine (PAXIL) 40 MG tablet; Take 1 tablet (40 mg total) by mouth daily.  Dispense: 90 tablet; Refill: 1  2. Hypothyroidism, unspecified hypothyroidism type  - levothyroxine (SYNTHROID, LEVOTHROID) 125 MCG tablet; Take 1 tablet (125 mcg total) by mouth daily before breakfast.  Dispense: 90 tablet; Refill: 1  3. Cannot sleep Continue on temazepam as prescribed - temazepam (RESTORIL) 15 MG capsule; Take 1 capsule (15 mg total) by mouth at bedtime as needed. for sleep  Dispense: 90 capsule; Refill: 1  4. Benign essential HTN BP stable on present therapy - bisoprolol (ZEBETA) 10 MG tablet; Take 1 tablet (10 mg total) by mouth daily.  Dispense: 90 tablet; Refill: 1   Laquan Beier Asad A. [facility] 03/10/2016 4:11 PM

## 2016-03-16 DIAGNOSIS — M9901 Segmental and somatic dysfunction of cervical region: Secondary | ICD-10-CM | POA: Diagnosis not present

## 2016-03-16 DIAGNOSIS — G540 Brachial plexus disorders: Secondary | ICD-10-CM | POA: Diagnosis not present

## 2016-03-16 DIAGNOSIS — R51 Headache: Secondary | ICD-10-CM | POA: Diagnosis not present

## 2016-03-16 DIAGNOSIS — M6240 Contracture of muscle, unspecified site: Secondary | ICD-10-CM | POA: Diagnosis not present

## 2016-03-16 DIAGNOSIS — M9902 Segmental and somatic dysfunction of thoracic region: Secondary | ICD-10-CM | POA: Diagnosis not present

## 2016-03-16 DIAGNOSIS — Z72820 Sleep deprivation: Secondary | ICD-10-CM | POA: Diagnosis not present

## 2016-03-16 DIAGNOSIS — M791 Myalgia: Secondary | ICD-10-CM | POA: Diagnosis not present

## 2016-06-16 DIAGNOSIS — G4733 Obstructive sleep apnea (adult) (pediatric): Secondary | ICD-10-CM | POA: Diagnosis not present

## 2016-06-16 DIAGNOSIS — R0609 Other forms of dyspnea: Secondary | ICD-10-CM | POA: Diagnosis not present

## 2016-06-16 DIAGNOSIS — Z8709 Personal history of other diseases of the respiratory system: Secondary | ICD-10-CM | POA: Diagnosis not present

## 2016-07-06 DIAGNOSIS — J329 Chronic sinusitis, unspecified: Secondary | ICD-10-CM | POA: Diagnosis not present

## 2016-07-06 DIAGNOSIS — Z23 Encounter for immunization: Secondary | ICD-10-CM | POA: Diagnosis not present

## 2016-07-06 DIAGNOSIS — G4733 Obstructive sleep apnea (adult) (pediatric): Secondary | ICD-10-CM | POA: Diagnosis not present

## 2016-07-06 DIAGNOSIS — E039 Hypothyroidism, unspecified: Secondary | ICD-10-CM | POA: Diagnosis not present

## 2016-07-06 DIAGNOSIS — K219 Gastro-esophageal reflux disease without esophagitis: Secondary | ICD-10-CM | POA: Diagnosis not present

## 2016-09-03 DIAGNOSIS — G4733 Obstructive sleep apnea (adult) (pediatric): Secondary | ICD-10-CM | POA: Diagnosis not present

## 2016-09-03 DIAGNOSIS — E039 Hypothyroidism, unspecified: Secondary | ICD-10-CM | POA: Diagnosis not present

## 2016-09-03 DIAGNOSIS — F329 Major depressive disorder, single episode, unspecified: Secondary | ICD-10-CM | POA: Diagnosis not present

## 2016-09-03 DIAGNOSIS — I10 Essential (primary) hypertension: Secondary | ICD-10-CM | POA: Diagnosis not present

## 2016-09-30 DIAGNOSIS — G4750 Parasomnia, unspecified: Secondary | ICD-10-CM | POA: Diagnosis not present

## 2016-09-30 DIAGNOSIS — G47411 Narcolepsy with cataplexy: Secondary | ICD-10-CM | POA: Diagnosis not present

## 2016-09-30 DIAGNOSIS — G4730 Sleep apnea, unspecified: Secondary | ICD-10-CM | POA: Diagnosis not present

## 2016-09-30 DIAGNOSIS — G4711 Idiopathic hypersomnia with long sleep time: Secondary | ICD-10-CM | POA: Diagnosis not present

## 2016-09-30 DIAGNOSIS — R0681 Apnea, not elsewhere classified: Secondary | ICD-10-CM | POA: Diagnosis not present

## 2016-10-02 DIAGNOSIS — G4733 Obstructive sleep apnea (adult) (pediatric): Secondary | ICD-10-CM | POA: Diagnosis not present

## 2016-10-02 DIAGNOSIS — G4750 Parasomnia, unspecified: Secondary | ICD-10-CM | POA: Diagnosis not present

## 2016-10-02 DIAGNOSIS — G4710 Hypersomnia, unspecified: Secondary | ICD-10-CM | POA: Diagnosis not present

## 2016-10-02 DIAGNOSIS — G4701 Insomnia due to medical condition: Secondary | ICD-10-CM | POA: Diagnosis not present

## 2016-10-03 DIAGNOSIS — G47411 Narcolepsy with cataplexy: Secondary | ICD-10-CM | POA: Diagnosis not present

## 2016-10-03 DIAGNOSIS — G4753 Recurrent isolated sleep paralysis: Secondary | ICD-10-CM | POA: Diagnosis not present

## 2016-10-05 DIAGNOSIS — G4733 Obstructive sleep apnea (adult) (pediatric): Secondary | ICD-10-CM | POA: Diagnosis not present

## 2016-10-05 DIAGNOSIS — G4761 Periodic limb movement disorder: Secondary | ICD-10-CM | POA: Diagnosis not present

## 2016-10-05 DIAGNOSIS — G4710 Hypersomnia, unspecified: Secondary | ICD-10-CM | POA: Diagnosis not present

## 2016-10-05 DIAGNOSIS — G478 Other sleep disorders: Secondary | ICD-10-CM | POA: Diagnosis not present

## 2016-10-09 ENCOUNTER — Telehealth: Payer: Self-pay

## 2016-10-09 NOTE — Telephone Encounter (Signed)
Called pt to schedule AWV, left message, ANR

## 2016-11-14 DIAGNOSIS — J0191 Acute recurrent sinusitis, unspecified: Secondary | ICD-10-CM | POA: Diagnosis not present

## 2016-11-17 DIAGNOSIS — G4761 Periodic limb movement disorder: Secondary | ICD-10-CM | POA: Diagnosis not present

## 2016-11-17 DIAGNOSIS — G4733 Obstructive sleep apnea (adult) (pediatric): Secondary | ICD-10-CM | POA: Diagnosis not present

## 2016-11-17 DIAGNOSIS — G4710 Hypersomnia, unspecified: Secondary | ICD-10-CM | POA: Diagnosis not present

## 2016-11-17 DIAGNOSIS — R0681 Apnea, not elsewhere classified: Secondary | ICD-10-CM | POA: Diagnosis not present

## 2017-02-02 DIAGNOSIS — L814 Other melanin hyperpigmentation: Secondary | ICD-10-CM | POA: Diagnosis not present

## 2017-02-02 DIAGNOSIS — Z6836 Body mass index (BMI) 36.0-36.9, adult: Secondary | ICD-10-CM | POA: Diagnosis not present

## 2017-02-02 DIAGNOSIS — D485 Neoplasm of uncertain behavior of skin: Secondary | ICD-10-CM | POA: Diagnosis not present

## 2017-02-11 DIAGNOSIS — Z4802 Encounter for removal of sutures: Secondary | ICD-10-CM | POA: Diagnosis not present

## 2017-02-11 DIAGNOSIS — L814 Other melanin hyperpigmentation: Secondary | ICD-10-CM | POA: Diagnosis not present

## 2017-02-23 DIAGNOSIS — G4736 Sleep related hypoventilation in conditions classified elsewhere: Secondary | ICD-10-CM | POA: Diagnosis not present

## 2017-02-23 DIAGNOSIS — G4701 Insomnia due to medical condition: Secondary | ICD-10-CM | POA: Diagnosis not present

## 2017-02-23 DIAGNOSIS — R0681 Apnea, not elsewhere classified: Secondary | ICD-10-CM | POA: Diagnosis not present

## 2017-02-23 DIAGNOSIS — G4733 Obstructive sleep apnea (adult) (pediatric): Secondary | ICD-10-CM | POA: Diagnosis not present

## 2017-02-27 DIAGNOSIS — J019 Acute sinusitis, unspecified: Secondary | ICD-10-CM | POA: Diagnosis not present

## 2017-02-27 DIAGNOSIS — Z6837 Body mass index (BMI) 37.0-37.9, adult: Secondary | ICD-10-CM | POA: Diagnosis not present

## 2017-04-14 DIAGNOSIS — Z1211 Encounter for screening for malignant neoplasm of colon: Secondary | ICD-10-CM | POA: Diagnosis not present

## 2017-04-14 DIAGNOSIS — F5101 Primary insomnia: Secondary | ICD-10-CM | POA: Diagnosis not present

## 2017-04-14 DIAGNOSIS — F330 Major depressive disorder, recurrent, mild: Secondary | ICD-10-CM | POA: Diagnosis not present

## 2017-04-14 DIAGNOSIS — F411 Generalized anxiety disorder: Secondary | ICD-10-CM | POA: Diagnosis not present

## 2017-04-14 DIAGNOSIS — Z6837 Body mass index (BMI) 37.0-37.9, adult: Secondary | ICD-10-CM | POA: Diagnosis not present

## 2017-04-14 DIAGNOSIS — Z1389 Encounter for screening for other disorder: Secondary | ICD-10-CM | POA: Diagnosis not present

## 2017-04-14 DIAGNOSIS — E039 Hypothyroidism, unspecified: Secondary | ICD-10-CM | POA: Diagnosis not present

## 2017-04-14 DIAGNOSIS — J069 Acute upper respiratory infection, unspecified: Secondary | ICD-10-CM | POA: Diagnosis not present

## 2017-04-14 DIAGNOSIS — K219 Gastro-esophageal reflux disease without esophagitis: Secondary | ICD-10-CM | POA: Diagnosis not present

## 2017-04-14 DIAGNOSIS — I10 Essential (primary) hypertension: Secondary | ICD-10-CM | POA: Diagnosis not present

## 2017-04-15 DIAGNOSIS — K219 Gastro-esophageal reflux disease without esophagitis: Secondary | ICD-10-CM | POA: Diagnosis not present

## 2017-04-15 DIAGNOSIS — F411 Generalized anxiety disorder: Secondary | ICD-10-CM | POA: Diagnosis not present

## 2017-04-15 DIAGNOSIS — E039 Hypothyroidism, unspecified: Secondary | ICD-10-CM | POA: Diagnosis not present

## 2017-04-15 DIAGNOSIS — J069 Acute upper respiratory infection, unspecified: Secondary | ICD-10-CM | POA: Diagnosis not present

## 2017-04-15 DIAGNOSIS — Z6837 Body mass index (BMI) 37.0-37.9, adult: Secondary | ICD-10-CM | POA: Diagnosis not present

## 2017-04-15 DIAGNOSIS — I10 Essential (primary) hypertension: Secondary | ICD-10-CM | POA: Diagnosis not present

## 2017-04-15 DIAGNOSIS — F5101 Primary insomnia: Secondary | ICD-10-CM | POA: Diagnosis not present

## 2017-04-15 DIAGNOSIS — F330 Major depressive disorder, recurrent, mild: Secondary | ICD-10-CM | POA: Diagnosis not present

## 2017-04-15 DIAGNOSIS — Z1211 Encounter for screening for malignant neoplasm of colon: Secondary | ICD-10-CM | POA: Diagnosis not present

## 2017-04-15 DIAGNOSIS — Z1389 Encounter for screening for other disorder: Secondary | ICD-10-CM | POA: Diagnosis not present

## 2017-04-27 DIAGNOSIS — Z23 Encounter for immunization: Secondary | ICD-10-CM | POA: Diagnosis not present

## 2017-05-11 DIAGNOSIS — Z1231 Encounter for screening mammogram for malignant neoplasm of breast: Secondary | ICD-10-CM | POA: Diagnosis not present

## 2017-05-17 DIAGNOSIS — Z124 Encounter for screening for malignant neoplasm of cervix: Secondary | ICD-10-CM | POA: Diagnosis not present

## 2017-05-17 DIAGNOSIS — Z1239 Encounter for other screening for malignant neoplasm of breast: Secondary | ICD-10-CM | POA: Diagnosis not present

## 2017-05-17 DIAGNOSIS — Z01419 Encounter for gynecological examination (general) (routine) without abnormal findings: Secondary | ICD-10-CM | POA: Diagnosis not present

## 2017-05-17 DIAGNOSIS — Z1211 Encounter for screening for malignant neoplasm of colon: Secondary | ICD-10-CM | POA: Diagnosis not present

## 2017-05-17 DIAGNOSIS — Z6837 Body mass index (BMI) 37.0-37.9, adult: Secondary | ICD-10-CM | POA: Diagnosis not present

## 2017-05-20 ENCOUNTER — Telehealth: Payer: Self-pay

## 2017-05-20 NOTE — Telephone Encounter (Signed)
Left a message letting the pt know I did not call her an d I don't see anything in the chart that we need to speak to her about.

## 2017-05-20 NOTE — Telephone Encounter (Signed)
Copied from [place] [id number]. Topic: Quick Communication - Office Called Patient >> May 20, 2017 12:55 PM Ahmed Prima L wrote: Reason for CRM:  Patient is returning a call from someone at the office. She says they have called three times over the week. She says she lives in [place] now, and she wants to know what the office is needing from her.

## 2017-05-27 DIAGNOSIS — K317 Polyp of stomach and duodenum: Secondary | ICD-10-CM | POA: Diagnosis not present

## 2017-05-27 DIAGNOSIS — K219 Gastro-esophageal reflux disease without esophagitis: Secondary | ICD-10-CM | POA: Diagnosis not present

## 2017-05-27 DIAGNOSIS — K2970 Gastritis, unspecified, without bleeding: Secondary | ICD-10-CM | POA: Diagnosis not present

## 2017-05-31 DIAGNOSIS — I10 Essential (primary) hypertension: Secondary | ICD-10-CM | POA: Diagnosis not present

## 2017-05-31 DIAGNOSIS — R7301 Impaired fasting glucose: Secondary | ICD-10-CM | POA: Diagnosis not present

## 2017-05-31 DIAGNOSIS — Z6837 Body mass index (BMI) 37.0-37.9, adult: Secondary | ICD-10-CM | POA: Diagnosis not present

## 2017-05-31 DIAGNOSIS — D493 Neoplasm of unspecified behavior of breast: Secondary | ICD-10-CM | POA: Diagnosis not present

## 2017-05-31 DIAGNOSIS — E782 Mixed hyperlipidemia: Secondary | ICD-10-CM | POA: Diagnosis not present

## 2017-06-29 DIAGNOSIS — G4733 Obstructive sleep apnea (adult) (pediatric): Secondary | ICD-10-CM | POA: Diagnosis not present

## 2017-06-29 DIAGNOSIS — R0681 Apnea, not elsewhere classified: Secondary | ICD-10-CM | POA: Diagnosis not present

## 2017-06-29 DIAGNOSIS — G478 Other sleep disorders: Secondary | ICD-10-CM | POA: Diagnosis not present

## 2017-06-29 DIAGNOSIS — G4710 Hypersomnia, unspecified: Secondary | ICD-10-CM | POA: Diagnosis not present

## 2017-08-20 DIAGNOSIS — K219 Gastro-esophageal reflux disease without esophagitis: Secondary | ICD-10-CM | POA: Diagnosis not present

## 2017-08-20 DIAGNOSIS — K2940 Chronic atrophic gastritis without bleeding: Secondary | ICD-10-CM | POA: Diagnosis not present

## 2017-08-20 DIAGNOSIS — K317 Polyp of stomach and duodenum: Secondary | ICD-10-CM | POA: Diagnosis not present

## 2017-10-06 DIAGNOSIS — F324 Major depressive disorder, single episode, in partial remission: Secondary | ICD-10-CM | POA: Diagnosis not present

## 2017-10-06 DIAGNOSIS — G4733 Obstructive sleep apnea (adult) (pediatric): Secondary | ICD-10-CM | POA: Diagnosis not present

## 2017-10-06 DIAGNOSIS — Z6834 Body mass index (BMI) 34.0-34.9, adult: Secondary | ICD-10-CM | POA: Diagnosis not present

## 2017-10-06 DIAGNOSIS — F5101 Primary insomnia: Secondary | ICD-10-CM | POA: Diagnosis not present

## 2017-10-06 DIAGNOSIS — I10 Essential (primary) hypertension: Secondary | ICD-10-CM | POA: Diagnosis not present

## 2017-10-06 DIAGNOSIS — E039 Hypothyroidism, unspecified: Secondary | ICD-10-CM | POA: Diagnosis not present

## 2017-10-06 DIAGNOSIS — K219 Gastro-esophageal reflux disease without esophagitis: Secondary | ICD-10-CM | POA: Diagnosis not present

## 2017-10-06 DIAGNOSIS — R5383 Other fatigue: Secondary | ICD-10-CM | POA: Diagnosis not present

## 2017-10-12 DIAGNOSIS — I10 Essential (primary) hypertension: Secondary | ICD-10-CM | POA: Diagnosis not present

## 2017-10-12 DIAGNOSIS — F324 Major depressive disorder, single episode, in partial remission: Secondary | ICD-10-CM | POA: Diagnosis not present

## 2017-10-12 DIAGNOSIS — E782 Mixed hyperlipidemia: Secondary | ICD-10-CM | POA: Diagnosis not present

## 2017-10-12 DIAGNOSIS — K219 Gastro-esophageal reflux disease without esophagitis: Secondary | ICD-10-CM | POA: Diagnosis not present

## 2017-10-12 DIAGNOSIS — G4733 Obstructive sleep apnea (adult) (pediatric): Secondary | ICD-10-CM | POA: Diagnosis not present

## 2017-10-12 DIAGNOSIS — E039 Hypothyroidism, unspecified: Secondary | ICD-10-CM | POA: Diagnosis not present

## 2017-10-12 DIAGNOSIS — R5383 Other fatigue: Secondary | ICD-10-CM | POA: Diagnosis not present

## 2017-10-12 DIAGNOSIS — F5101 Primary insomnia: Secondary | ICD-10-CM | POA: Diagnosis not present

## 2017-11-27 DIAGNOSIS — J019 Acute sinusitis, unspecified: Secondary | ICD-10-CM | POA: Diagnosis not present

## 2018-01-11 DIAGNOSIS — I10 Essential (primary) hypertension: Secondary | ICD-10-CM | POA: Diagnosis not present

## 2018-01-11 DIAGNOSIS — E039 Hypothyroidism, unspecified: Secondary | ICD-10-CM | POA: Diagnosis not present

## 2018-01-11 DIAGNOSIS — K219 Gastro-esophageal reflux disease without esophagitis: Secondary | ICD-10-CM | POA: Diagnosis not present

## 2018-01-11 DIAGNOSIS — R5383 Other fatigue: Secondary | ICD-10-CM | POA: Diagnosis not present

## 2018-01-11 DIAGNOSIS — E538 Deficiency of other specified B group vitamins: Secondary | ICD-10-CM | POA: Diagnosis not present

## 2018-01-17 DIAGNOSIS — F324 Major depressive disorder, single episode, in partial remission: Secondary | ICD-10-CM | POA: Diagnosis not present

## 2018-01-17 DIAGNOSIS — I10 Essential (primary) hypertension: Secondary | ICD-10-CM | POA: Diagnosis not present

## 2018-01-17 DIAGNOSIS — E782 Mixed hyperlipidemia: Secondary | ICD-10-CM | POA: Diagnosis not present

## 2018-01-17 DIAGNOSIS — F5101 Primary insomnia: Secondary | ICD-10-CM | POA: Diagnosis not present

## 2018-01-17 DIAGNOSIS — K219 Gastro-esophageal reflux disease without esophagitis: Secondary | ICD-10-CM | POA: Diagnosis not present

## 2018-01-17 DIAGNOSIS — E039 Hypothyroidism, unspecified: Secondary | ICD-10-CM | POA: Diagnosis not present

## 2018-01-17 DIAGNOSIS — G4733 Obstructive sleep apnea (adult) (pediatric): Secondary | ICD-10-CM | POA: Diagnosis not present

## 2018-01-18 DIAGNOSIS — I10 Essential (primary) hypertension: Secondary | ICD-10-CM | POA: Diagnosis not present

## 2018-01-18 DIAGNOSIS — E782 Mixed hyperlipidemia: Secondary | ICD-10-CM | POA: Diagnosis not present

## 2018-01-18 DIAGNOSIS — E039 Hypothyroidism, unspecified: Secondary | ICD-10-CM | POA: Diagnosis not present

## 2018-01-18 DIAGNOSIS — F324 Major depressive disorder, single episode, in partial remission: Secondary | ICD-10-CM | POA: Diagnosis not present

## 2018-03-14 DIAGNOSIS — E039 Hypothyroidism, unspecified: Secondary | ICD-10-CM | POA: Diagnosis not present

## 2018-03-14 DIAGNOSIS — F324 Major depressive disorder, single episode, in partial remission: Secondary | ICD-10-CM | POA: Diagnosis not present

## 2018-03-14 DIAGNOSIS — E782 Mixed hyperlipidemia: Secondary | ICD-10-CM | POA: Diagnosis not present

## 2018-03-14 DIAGNOSIS — I10 Essential (primary) hypertension: Secondary | ICD-10-CM | POA: Diagnosis not present

## 2018-03-18 DIAGNOSIS — F5101 Primary insomnia: Secondary | ICD-10-CM | POA: Diagnosis not present

## 2018-03-18 DIAGNOSIS — J302 Other seasonal allergic rhinitis: Secondary | ICD-10-CM | POA: Diagnosis not present

## 2018-03-18 DIAGNOSIS — K219 Gastro-esophageal reflux disease without esophagitis: Secondary | ICD-10-CM | POA: Diagnosis not present

## 2018-03-18 DIAGNOSIS — J019 Acute sinusitis, unspecified: Secondary | ICD-10-CM | POA: Diagnosis not present

## 2018-03-29 DIAGNOSIS — E782 Mixed hyperlipidemia: Secondary | ICD-10-CM | POA: Diagnosis not present

## 2018-03-29 DIAGNOSIS — I10 Essential (primary) hypertension: Secondary | ICD-10-CM | POA: Diagnosis not present

## 2018-03-29 DIAGNOSIS — F324 Major depressive disorder, single episode, in partial remission: Secondary | ICD-10-CM | POA: Diagnosis not present

## 2018-03-29 DIAGNOSIS — E039 Hypothyroidism, unspecified: Secondary | ICD-10-CM | POA: Diagnosis not present

## 2018-04-20 DIAGNOSIS — E782 Mixed hyperlipidemia: Secondary | ICD-10-CM | POA: Diagnosis not present

## 2018-04-20 DIAGNOSIS — E039 Hypothyroidism, unspecified: Secondary | ICD-10-CM | POA: Diagnosis not present

## 2018-04-20 DIAGNOSIS — I10 Essential (primary) hypertension: Secondary | ICD-10-CM | POA: Diagnosis not present

## 2018-04-26 DIAGNOSIS — F5101 Primary insomnia: Secondary | ICD-10-CM | POA: Diagnosis not present

## 2018-04-26 DIAGNOSIS — Z23 Encounter for immunization: Secondary | ICD-10-CM | POA: Diagnosis not present

## 2018-04-26 DIAGNOSIS — K219 Gastro-esophageal reflux disease without esophagitis: Secondary | ICD-10-CM | POA: Diagnosis not present

## 2018-04-26 DIAGNOSIS — E782 Mixed hyperlipidemia: Secondary | ICD-10-CM | POA: Diagnosis not present

## 2018-04-26 DIAGNOSIS — E6609 Other obesity due to excess calories: Secondary | ICD-10-CM | POA: Diagnosis not present

## 2018-04-26 DIAGNOSIS — E039 Hypothyroidism, unspecified: Secondary | ICD-10-CM | POA: Diagnosis not present

## 2018-04-26 DIAGNOSIS — Z0000 Encounter for general adult medical examination without abnormal findings: Secondary | ICD-10-CM | POA: Diagnosis not present

## 2018-04-26 DIAGNOSIS — Z6836 Body mass index (BMI) 36.0-36.9, adult: Secondary | ICD-10-CM | POA: Diagnosis not present

## 2018-04-26 DIAGNOSIS — I10 Essential (primary) hypertension: Secondary | ICD-10-CM | POA: Diagnosis not present

## 2018-04-26 DIAGNOSIS — F324 Major depressive disorder, single episode, in partial remission: Secondary | ICD-10-CM | POA: Diagnosis not present

## 2018-05-07 IMAGING — CR DG CHEST 2V
1 series · 2 of 2 positions shown · non-contrast
Comparison: 03/19/2014

CLINICAL DATA: Chest pain

EXAM:
CHEST  2 VIEW

[Series 1: dg chest 2 view · 0.14mm/px · 2 of 2 slices shown]
[im 1/2]
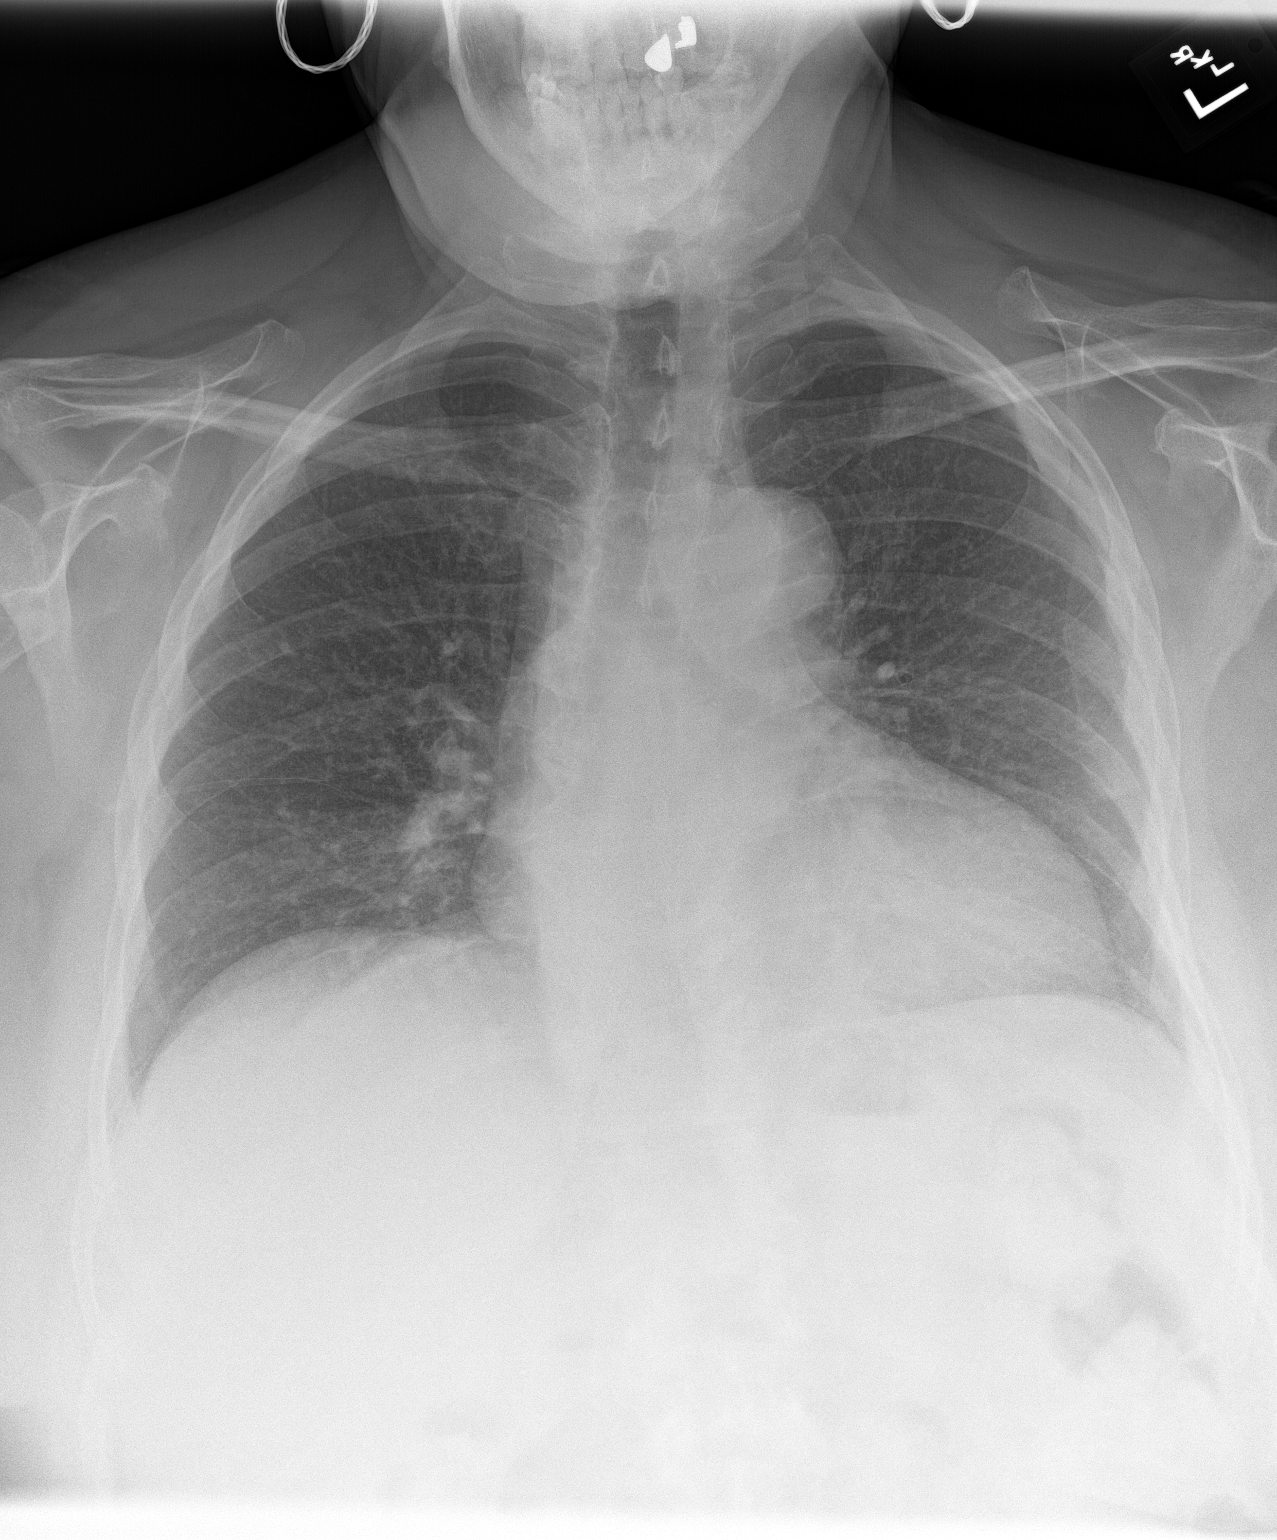
[im 2/2]
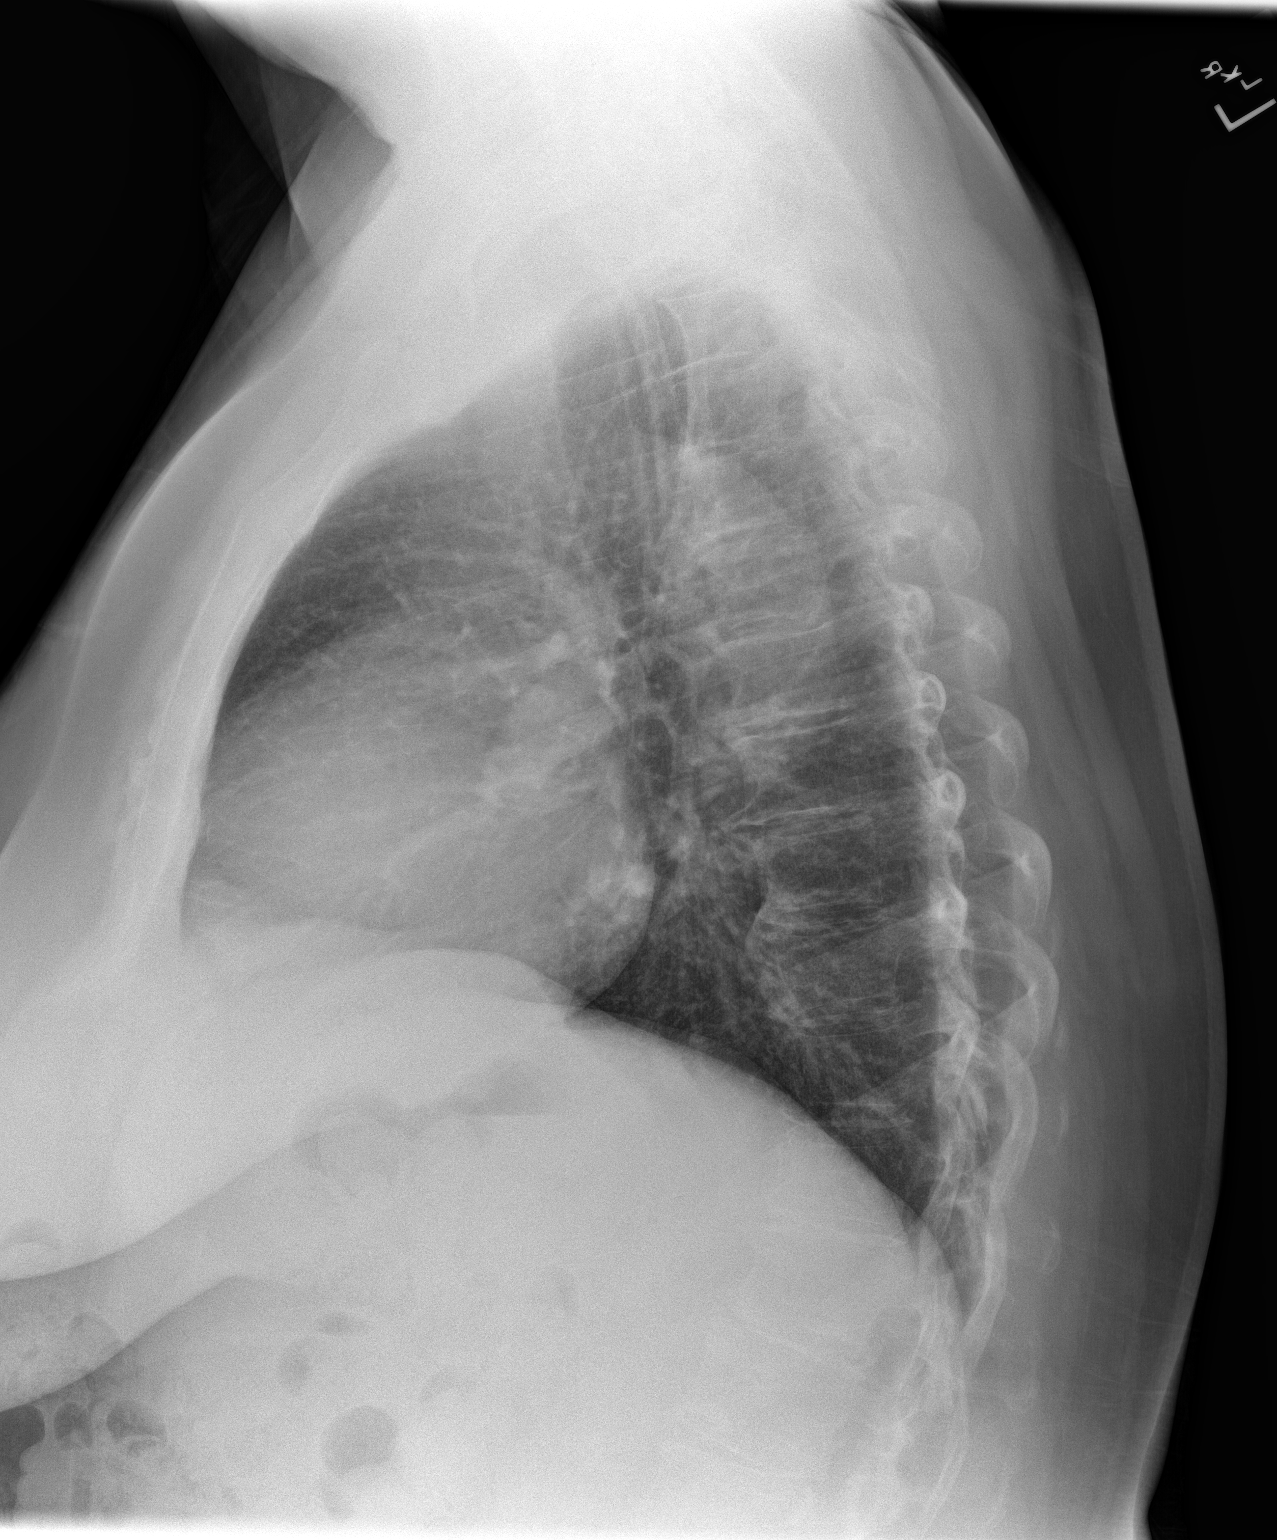

[2 of 2 positions shown; findings below may reference images not displayed]

FINDINGS: Cardiomegaly that is accentuated by low volumes. Aortic tortuosity
that is similar to prior. There is no edema, consolidation,
effusion, or pneumothorax.
IMPRESSION: Cardiomegaly without failure.
